# Patient Record
Sex: Female | Born: 1985 | Race: White | Hispanic: Yes | Marital: Single | Smoking: Never smoker
Health system: Southern US, Community
[De-identification: ages and names within clinical notes are randomized; demographics above are authoritative.]

## PROBLEM LIST (undated history)

## (undated) DIAGNOSIS — N879 Dysplasia of cervix uteri, unspecified: Secondary | ICD-10-CM

## (undated) DIAGNOSIS — R87610 Atypical squamous cells of undetermined significance on cytologic smear of cervix (ASC-US): Secondary | ICD-10-CM

## (undated) DIAGNOSIS — Z8759 Personal history of other complications of pregnancy, childbirth and the puerperium: Secondary | ICD-10-CM

## (undated) HISTORY — PX: OTHER SURGICAL HISTORY: SHX169

## (undated) HISTORY — DX: Atypical squamous cells of undetermined significance on cytologic smear of cervix (ASC-US): R87.610

## (undated) HISTORY — DX: Personal history of other complications of pregnancy, childbirth and the puerperium: Z87.59

## (undated) HISTORY — DX: Dysplasia of cervix uteri, unspecified: N87.9

---

## 2016-12-10 DIAGNOSIS — N879 Dysplasia of cervix uteri, unspecified: Secondary | ICD-10-CM | POA: Insufficient documentation

## 2018-06-28 ENCOUNTER — Other Ambulatory Visit: Payer: Self-pay | Admitting: Advanced Practice Midwife

## 2018-06-28 DIAGNOSIS — Z3481 Encounter for supervision of other normal pregnancy, first trimester: Secondary | ICD-10-CM

## 2018-06-28 LAB — OB RESULTS CONSOLE HIV ANTIBODY (ROUTINE TESTING): HIV: NONREACTIVE

## 2018-06-29 LAB — OB RESULTS CONSOLE VARICELLA ZOSTER ANTIBODY, IGG: Varicella: IMMUNE

## 2018-06-29 LAB — OB RESULTS CONSOLE HEPATITIS B SURFACE ANTIGEN: Hepatitis B Surface Ag: NEGATIVE

## 2018-06-29 LAB — OB RESULTS CONSOLE RPR: RPR: NONREACTIVE

## 2018-06-29 LAB — OB RESULTS CONSOLE RUBELLA ANTIBODY, IGM: Rubella: NON-IMMUNE/NOT IMMUNE

## 2018-07-05 ENCOUNTER — Ambulatory Visit
Admission: RE | Admit: 2018-07-05 | Discharge: 2018-07-05 | Disposition: A | Discharge status: Home / Self Care | Payer: Self-pay | Source: Ambulatory Visit | Attending: Advanced Practice Midwife | Admitting: Advanced Practice Midwife

## 2018-07-05 DIAGNOSIS — Z3689 Encounter for other specified antenatal screening: Secondary | ICD-10-CM | POA: Insufficient documentation

## 2018-07-05 DIAGNOSIS — Z3481 Encounter for supervision of other normal pregnancy, first trimester: Secondary | ICD-10-CM

## 2018-07-05 DIAGNOSIS — O208 Other hemorrhage in early pregnancy: Secondary | ICD-10-CM | POA: Insufficient documentation

## 2018-07-05 DIAGNOSIS — Z3A09 9 weeks gestation of pregnancy: Secondary | ICD-10-CM | POA: Insufficient documentation

## 2018-09-01 NOTE — L&D Delivery Note (Signed)
Date of delivery: 02/14/2019 Estimated Date of Delivery: 02/05/19 Patient's last menstrual period was 04/28/2018 (approximate). EGA: [redacted]w[redacted]d  Delivery Note At 6:23 AM a viable female was delivered via Vaginal, Spontaneous (Presentation: cephalic; direct OA).  APGAR: 3, 9; weight:  Pending skin to skin.   Placenta status: spontaneous, intact, circumvallate, several infarcts along fetal surface, no calcifications or thrombosed cotyledons.  Cord: 3vv, with the following complications: tight nuchal x1, reduced after delivery.  Cord pH: not collected.  Anesthesia:  None Episiotomy: None Lacerations: None Suture Repair: n/a Est. Blood Loss (mL): 15cc (measured)  Mom presented to L&D with induction of labor for past due date.  She was given cytotec x2 and spontneously ruptured. Labor progressed expectantly after that.  Progressed to complete, second stage: <20 mins, with delivery of fetal head and restitution to LOT.  Nuchal cord identified but unable to reduce at perineum.  Anterior then posterior shoulders delivered without difficulty.  Cord then reduced, and baby placed on mom's chest, and attended to by peds. Cord was clamped and cut after ~20 seconds; baby was flaccid for first minute of life, but by 2 minutes of life was crying, with appropriate tone, and improving color.   Placenta spontaneously delivered, intact.   IV pitocin given for hemorrhage prophylaxis.  We sang happy birthday to baby [name].   Mom to postpartum.  Baby to Couplet care / Skin to Skin.  Chelsea C Ward 02/14/2019, 7:19 AM

## 2018-11-15 LAB — OB RESULTS CONSOLE HIV ANTIBODY (ROUTINE TESTING): HIV: NONREACTIVE

## 2018-11-16 LAB — OB RESULTS CONSOLE RPR: RPR: NONREACTIVE

## 2019-01-12 LAB — OB RESULTS CONSOLE GC/CHLAMYDIA
Chlamydia: NEGATIVE
Gonorrhea: NEGATIVE

## 2019-01-12 LAB — OB RESULTS CONSOLE GBS: GBS: NEGATIVE

## 2019-02-09 ENCOUNTER — Encounter: Payer: Self-pay | Admitting: Certified Nurse Midwife

## 2019-02-09 ENCOUNTER — Other Ambulatory Visit: Payer: Self-pay | Admitting: Certified Nurse Midwife

## 2019-02-09 NOTE — Progress Notes (Signed)
Shirley Park is a 33 y.o. G69P2002 female dated by [redacted]w[redacted]d ultrasound on 07/05/2018.  Pregnancy Issues: 1. Elevated early 1h OGTT 137 with normal 3h 87/111/115/102 on 07/19/2018, normal 3h 83/91/132/109 on 11/15/2018 2. Prepregnancy BMI 35.4, on aspirin 81mg  3. History of abnormal pap smears with +hrHPV in 2016, 2018, and 2019; colposcopy scheduled for 03/2019 4. Illiterate: speaks Spanish, but cannot read or write in Spanish 5. Rubella non-immune  Prenatal care site: [facility]   Prenatal Labs: Blood type/Rh B+  Antibody screen neg  Rubella Non-immune  Varicella Immune  RPR NR  HBsAg Neg  HIV NR  GC neg  Chlamydia neg  Genetic screening declined  1 hour GTT 137 on 06/29/2018  3 hour GTT 87/111/115/102 on 07/19/2018 83/91/132/109 on 11/15/2018  GBS negative   Post Partum Planning: - Infant feeding: breast/formula - Contraception: Depo-Provera

## 2019-02-11 ENCOUNTER — Ambulatory Visit
Admission: RE | Admit: 2019-02-11 | Discharge: 2019-02-11 | Disposition: A | Discharge status: Home / Self Care | Payer: HRSA Program | Source: Ambulatory Visit | Attending: Certified Nurse Midwife | Admitting: Certified Nurse Midwife

## 2019-02-11 ENCOUNTER — Other Ambulatory Visit: Payer: Self-pay

## 2019-02-11 DIAGNOSIS — Z1159 Encounter for screening for other viral diseases: Secondary | ICD-10-CM | POA: Insufficient documentation

## 2019-02-12 LAB — NOVEL CORONAVIRUS, NAA (HOSP ORDER, SEND-OUT TO REF LAB; TAT 18-24 HRS): SARS-CoV-2, NAA: NOT DETECTED

## 2019-02-13 ENCOUNTER — Other Ambulatory Visit: Payer: Self-pay

## 2019-02-13 ENCOUNTER — Inpatient Hospital Stay
Admission: EM | Admit: 2019-02-13 | Discharge: 2019-02-15 | DRG: 807 | Disposition: A | Discharge status: Home / Self Care | Payer: Medicaid Other | Attending: Obstetrics & Gynecology | Admitting: Obstetrics & Gynecology

## 2019-02-13 DIAGNOSIS — Z3A41 41 weeks gestation of pregnancy: Secondary | ICD-10-CM

## 2019-02-13 DIAGNOSIS — O480 Post-term pregnancy: Secondary | ICD-10-CM | POA: Diagnosis present

## 2019-02-13 LAB — CBC
HCT: 37.2 % (ref 36.0–46.0)
Hemoglobin: 11.9 g/dL — ABNORMAL LOW (ref 12.0–15.0)
MCH: 24.6 pg — ABNORMAL LOW (ref 26.0–34.0)
MCHC: 32 g/dL (ref 30.0–36.0)
MCV: 77 fL — ABNORMAL LOW (ref 80.0–100.0)
Platelets: 306 10*3/uL (ref 150–400)
RBC: 4.83 MIL/uL (ref 3.87–5.11)
RDW: 16.8 % — ABNORMAL HIGH (ref 11.5–15.5)
WBC: 11.4 10*3/uL — ABNORMAL HIGH (ref 4.0–10.5)
nRBC: 0 % (ref 0.0–0.2)

## 2019-02-13 LAB — TYPE AND SCREEN
ABO/RH(D): B POS
Antibody Screen: NEGATIVE

## 2019-02-13 MED ORDER — MISOPROSTOL 25 MCG QUARTER TABLET
25.0000 ug | ORAL_TABLET | ORAL | Status: DC | PRN
  Administered 2019-02-13 (×2): 25 ug via BUCCAL
  Filled 2019-02-13 (×2): qty 1

## 2019-02-13 MED ORDER — MISOPROSTOL 200 MCG PO TABS
ORAL_TABLET | ORAL | Status: AC
  Administered 2019-02-13: 25 ug via BUCCAL
  Filled 2019-02-13: qty 4

## 2019-02-13 MED ORDER — LACTATED RINGERS IV SOLN
INTRAVENOUS | Status: DC
  Administered 2019-02-13 (×2): via INTRAVENOUS

## 2019-02-13 MED ORDER — OXYTOCIN 40 UNITS IN NORMAL SALINE INFUSION - SIMPLE MED: 2.5000 [IU]/h | INTRAVENOUS | Status: DC

## 2019-02-13 MED ORDER — OXYTOCIN BOLUS FROM INFUSION
500.0000 mL | Freq: Once | INTRAVENOUS | Status: AC
  Administered 2019-02-14: 500 mL via INTRAVENOUS

## 2019-02-13 MED ORDER — LIDOCAINE HCL (PF) 1 % IJ SOLN: 30.0000 mL | INTRAMUSCULAR | Status: DC | PRN

## 2019-02-13 MED ORDER — OXYTOCIN 10 UNIT/ML IJ SOLN
INTRAMUSCULAR | Status: AC
  Filled 2019-02-13: qty 2

## 2019-02-13 MED ORDER — ONDANSETRON HCL 4 MG/2ML IJ SOLN
4.0000 mg | Freq: Four times a day (QID) | INTRAMUSCULAR | Status: DC | PRN
  Administered 2019-02-14: 4 mg via INTRAVENOUS
  Filled 2019-02-13: qty 2

## 2019-02-13 MED ORDER — OXYTOCIN 40 UNITS IN NORMAL SALINE INFUSION - SIMPLE MED
INTRAVENOUS | Status: AC
  Filled 2019-02-13: qty 1000

## 2019-02-13 MED ORDER — MISOPROSTOL 25 MCG QUARTER TABLET
25.0000 ug | ORAL_TABLET | ORAL | Status: DC | PRN
  Administered 2019-02-13: 25 ug via VAGINAL
  Filled 2019-02-13 (×2): qty 1

## 2019-02-13 MED ORDER — SOD CITRATE-CITRIC ACID 500-334 MG/5ML PO SOLN: 30.0000 mL | ORAL | Status: DC | PRN

## 2019-02-13 MED ORDER — ACETAMINOPHEN 325 MG PO TABS: 650.0000 mg | ORAL_TABLET | ORAL | Status: DC | PRN

## 2019-02-13 MED ORDER — AMMONIA AROMATIC IN INHA
RESPIRATORY_TRACT | Status: AC
  Filled 2019-02-13: qty 10

## 2019-02-13 MED ORDER — TERBUTALINE SULFATE 1 MG/ML IJ SOLN: 0.2500 mg | Freq: Once | INTRAMUSCULAR | Status: DC | PRN

## 2019-02-13 MED ORDER — LIDOCAINE HCL (PF) 1 % IJ SOLN
INTRAMUSCULAR | Status: AC
  Filled 2019-02-13: qty 30

## 2019-02-13 MED ORDER — BUTORPHANOL TARTRATE 2 MG/ML IJ SOLN
1.0000 mg | INTRAMUSCULAR | Status: DC | PRN
  Administered 2019-02-14: 1 mg via INTRAVENOUS
  Filled 2019-02-13: qty 1

## 2019-02-13 MED ORDER — LACTATED RINGERS IV SOLN: 500.0000 mL | INTRAVENOUS | Status: DC | PRN

## 2019-02-13 NOTE — H&P (Signed)
OB History & Physical   History of Present Illness:  Chief Complaint:   HPI:  Shirley Park is a 33 y.o. 564P2002 female at 3527w1d dated by 9wk 2 day US.  She presents to L&D for IOL for past due date  Patient's last menstrual period was 04/28/2018 (approximate). Estimated Date of Delivery: 02/05/19   +FM, no CTX, no LOF, no VB  Pregnancy Issues: 1. 1. Elevated early 1h OGTT 137 with normal 3h 87/111/115/102 on 07/19/2018, normal 3h 83/91/132/109 on 11/15/2018 2. Prepregnancy BMI 35.4, on aspirin 81mg  3. History of abnormal pap smears with +hrHPV in 2016, 2018, and 2019; colposcopy scheduled for 03/2019 4. Illiterate: speaks Spanish, but cannot read or write in Spanish 5. Rubella non-immune  Maternal Medical History:  History reviewed. No pertinent past medical history.  History reviewed. No pertinent surgical history.  Not on File  Prior to Admission medications   Medication Sig Start Date End Date Taking? Authorizing Provider  Prenatal Vit-Fe Fumarate-FA (PRENATAL MULTIVITAMIN) TABS tablet Take 1 tablet by mouth daily at 12 noon.   Yes [provider]     [facility]ren[facility]   Social History: She  reports that she has never smoked. She has never used smokeless tobacco. She reports that she does not drink alcohol or use drugs.  Family History: family history is not on file. no gyn cancers  Review of Systems: A full review of systems was performed and negative except as noted in the HPI.     Physical Exam:  Vital Signs: BP 131/80 (BP Location: Right Arm)   Pulse 69   Temp 98.2 F (36.8 C) (Oral)   Resp 18   Ht 5\' 3"  (1.6 m)   Wt 90.7 kg   LMP 04/28/2018 (Approximate)   BMI 35.43 kg/m  General: no acute distress.  HEENT: normocephalic, atraumatic Heart: regular rate & rhythm.  No murmurs/rubs/gallops Lungs: clear to auscultation bilaterally, normal respiratory effort Abdomen: soft, gravid, non-tender;  EFW:  7lb3oz Pelvic:   External: Normal external female genitalia  Cervix: Dilation: Closed / Effacement (%): 60, 70 / Station: -3    Extremities: non-tender, symmetric,  Mild edema bilaterally.  DTRs: 2+ Neurologic: Alert & oriented x 3.    Results for orders placed or performed during the hospital encounter of 02/13/19 (from the past 24 hour(s))  CBC     Status: Abnormal   Collection Time: 02/13/19  2:29 PM  Result Value Ref Range   WBC 11.4 (H) 4.0 - 10.5 K/uL   RBC 4.83 3.87 - 5.11 MIL/uL   Hemoglobin 11.9 (L) 12.0 - 15.0 g/dL   HCT 40.937.2 81.136.0 - 91.446.0 %   MCV 77.0 (L) 80.0 - 100.0 fL   MCH 24.6 (L) 26.0 - 34.0 pg   MCHC 32.0 30.0 - 36.0 g/dL   RDW 78.216.8 (H) 95.611.5 - 21.315.5 %   Platelets 306 150 - 400 K/uL   nRBC 0.0 0.0 - 0.2 %  Type and screen     Status: None   Collection Time: 02/13/19  2:29 PM  Result Value Ref Range   ABO/RH(D) B POS    Antibody Screen NEG    Sample Expiration      02/16/2019,2359 Performed at [facility], [address] [place], [place] [address]     Pertinent Results:  Prenatal Labs: Blood type/Rh B+  Antibody screen neg  Rubella Non-immune  Varicella Immune  RPR NR  HBsAg Neg  HIV NR  GC neg  Chlamydia neg  Genetic screening declined  1 hour GTT 137 on 06/29/2018  3 hour GTT 87/111/115/102 on 07/19/2018 83/91/132/109 on 11/15/2018  GBS negative    FHT: 140 mod + accels no decels TOCO: infrequent SVE:  Dilation: Closed / Effacement (%): 60, 70 / Station: -3    Cephalic by leopolds    Assessment:  Shirley Park is a 33 y.o. G46P2002 female at [redacted]w[redacted]d with IOL for past due date.   Plan:  1. Admit to Labor & Delivery 2. CBC, T&S, Clrs, IVF 3. GBS negative 4. Consents obtained. 5. Continuous efm/toco 6. Category 1 7. IOL with cytotec for cervical ripening  Post Partum Planning: - Infant feeding: breast/formula - Contraception: Depo-Provera   ----- Shirley Days, MD Attending Obstetrician and Gynecologist [facility], Department of [facility]

## 2019-02-14 MED ORDER — ONDANSETRON HCL 4 MG/2ML IJ SOLN: 4.0000 mg | INTRAMUSCULAR | Status: DC | PRN

## 2019-02-14 MED ORDER — DIBUCAINE (PERIANAL) 1 % EX OINT: 1.0000 "application " | TOPICAL_OINTMENT | CUTANEOUS | Status: DC | PRN

## 2019-02-14 MED ORDER — IBUPROFEN 600 MG PO TABS
600.0000 mg | ORAL_TABLET | Freq: Four times a day (QID) | ORAL | Status: DC
  Administered 2019-02-14 – 2019-02-15 (×4): 600 mg via ORAL
  Filled 2019-02-14 (×4): qty 1

## 2019-02-14 MED ORDER — WITCH HAZEL-GLYCERIN EX PADS: 1.0000 "application " | MEDICATED_PAD | CUTANEOUS | Status: DC

## 2019-02-14 MED ORDER — BENZOCAINE-MENTHOL 20-0.5 % EX AERO
1.0000 "application " | INHALATION_SPRAY | CUTANEOUS | Status: DC | PRN
  Filled 2019-02-14: qty 56

## 2019-02-14 MED ORDER — ONDANSETRON HCL 4 MG PO TABS: 4.0000 mg | ORAL_TABLET | ORAL | Status: DC | PRN

## 2019-02-14 MED ORDER — ACETAMINOPHEN 500 MG PO TABS
1000.0000 mg | ORAL_TABLET | Freq: Four times a day (QID) | ORAL | Status: DC | PRN
  Administered 2019-02-15: 1000 mg via ORAL
  Filled 2019-02-14: qty 2

## 2019-02-14 MED ORDER — DIPHENHYDRAMINE HCL 25 MG PO CAPS: 25.0000 mg | ORAL_CAPSULE | Freq: Four times a day (QID) | ORAL | Status: DC | PRN

## 2019-02-14 MED ORDER — MEDROXYPROGESTERONE ACETATE 150 MG/ML IM SUSP
150.0000 mg | Freq: Once | INTRAMUSCULAR | Status: DC
  Filled 2019-02-14: qty 1

## 2019-02-14 MED ORDER — MEASLES, MUMPS & RUBELLA VAC IJ SOLR
0.5000 mL | Freq: Once | INTRAMUSCULAR | Status: AC
  Administered 2019-02-15: 0.5 mL via SUBCUTANEOUS
  Filled 2019-02-14 (×2): qty 0.5

## 2019-02-14 MED ORDER — DOCUSATE SODIUM 100 MG PO CAPS
100.0000 mg | ORAL_CAPSULE | Freq: Two times a day (BID) | ORAL | Status: DC
  Administered 2019-02-14 – 2019-02-15 (×2): 100 mg via ORAL
  Filled 2019-02-14 (×2): qty 1

## 2019-02-14 MED ORDER — COCONUT OIL OIL
1.0000 "application " | TOPICAL_OIL | Status: DC | PRN
  Administered 2019-02-14: 1 via TOPICAL
  Filled 2019-02-14 (×2): qty 120

## 2019-02-14 MED ORDER — SIMETHICONE 80 MG PO CHEW: 80.0000 mg | CHEWABLE_TABLET | ORAL | Status: DC | PRN

## 2019-02-14 MED ORDER — PRENATAL MULTIVITAMIN CH
1.0000 | ORAL_TABLET | Freq: Every day | ORAL | Status: DC
  Administered 2019-02-14: 1 via ORAL
  Filled 2019-02-14: qty 1

## 2019-02-14 NOTE — Lactation Note (Signed)
This note was copied from a baby's chart. Lactation Consultation Note  Patient Name: Shirley Park [id number] Date: 02/14/2019   Observed mom breast feeding latching [name] independently with strong, rhythmic sucking and swallows.  Mom had no pillow support.  Pillows given and situated for comfort.  Mom breast fed 34 year old for 1 year, but only breast fed 34 year old for 3 months.  Mom started giving bottles of formula with 34 year old early thinking she did not have enough breast milk.  Mom reports wanting to give formula in addition to breast feeding this baby and asked if she could start bottles at about 2 weeks if baby was not getting enough of her milk.  Reviewed through interpreter about supply and demand and need for frequent draining of breast to bring in mature milk and ensure a plentiful supply of milk.  Hand expressed colostrum demonstrating to her that she has colostrum.  Discussed size of [name]'s stomach right now and that she should have sufficient volumes to just give her breast milk if she put him to the breast whenever he demonstrated feeding cues.  Reviewed normal course of lactation and routine newborn feeding patterns.  Lactation name and number written on white board and encouraged to call with any questions, concerns or assistance.    Maternal Data    Feeding    LATCH Score                   Interventions    Lactation Tools Discussed/Used     Consult Status      Shirley Park 02/14/2019, 4:11 PM

## 2019-02-14 NOTE — Lactation Note (Addendum)
This note was copied from a baby's chart. Lactation Consultation Note  Patient Name: Shirley Park GUYQI'H Date: 02/14/2019 Reason for consult: Follow-up assessment  FOB asking for bottle of formula.  FOB was not in room when explained stomach size, supply and demand and feeding patterns so went over with him.  FOB concerned that Shirley Park is showing feeding cues as soon as he puts him back in the crib, but when he puts him back to the breast he sucks for only about 10 minutes and goes back to sleep.  He remembers that with his girls, mom seemed to have more milk.  Explained that for first few days she has colostrum which is only teaspoons and tablespoon, but when mature milk came in on day 2 to 5 that mom would have ounces.  Shown on gradufeed amounts [name]'s stomach could hold for now and that he should be getting that from the breast because we did not want to overfill his stomach and him start spitting.  He was also concerned that Shirley Park has not had a void yet.  Explained that on average babies only had 1 void and 1 stool whole first 24 hours because he is only taking in small amounts and Shirley Park just had his second stool which was probably why he was a little fussy after his feeding.  Shown him normal out put through first week of life.  Demonstrated hand expression and could get several drops of colostrum.  Discussed how thick colostrum was and how he had to suck harder to get it out of breast for now, but when mature milk came in it would be spraying out like mom remembered with others.  FOB satisfied with explanations of why to postpone giving bottles of formula for now.  Mom put Shirley Park back to the breast for another 10 minutes after stool changed and he was contented. Maternal Data Formula Feeding for Exclusion: No Has patient been taught Hand Expression?: Yes Does the patient have breastfeeding experience prior to this delivery?: Yes  Feeding Feeding Type: Breast Fed  LATCH Score Latch:  Grasps breast easily, tongue down, lips flanged, rhythmical sucking.  Audible Swallowing: A few with stimulation  Type of Nipple: Everted at rest and after stimulation  Comfort (Breast/Nipple): Filling, red/small blisters or bruises, mild/mod discomfort  Hold (Positioning): No assistance needed to correctly position infant at breast.  LATCH Score: 8  Interventions Interventions: Reverse pressure;Breast compression;Adjust position;Support pillows;Coconut oil  Lactation Tools Discussed/Used WIC Program: Yes(Self Pay)   Consult Status Consult Status: PRN Follow-up type: Call as needed    Jarold Motto 02/14/2019, 5:56 PM

## 2019-02-15 LAB — CBC
HCT: 33.3 % — ABNORMAL LOW (ref 36.0–46.0)
Hemoglobin: 10.4 g/dL — ABNORMAL LOW (ref 12.0–15.0)
MCH: 24.3 pg — ABNORMAL LOW (ref 26.0–34.0)
MCHC: 31.2 g/dL (ref 30.0–36.0)
MCV: 77.8 fL — ABNORMAL LOW (ref 80.0–100.0)
Platelets: 242 10*3/uL (ref 150–400)
RBC: 4.28 MIL/uL (ref 3.87–5.11)
RDW: 17.1 % — ABNORMAL HIGH (ref 11.5–15.5)
WBC: 13 10*3/uL — ABNORMAL HIGH (ref 4.0–10.5)
nRBC: 0 % (ref 0.0–0.2)

## 2019-02-15 LAB — RPR: RPR Ser Ql: NONREACTIVE

## 2019-02-15 MED ORDER — IBUPROFEN 600 MG PO TABS: 600.0000 mg | ORAL_TABLET | Freq: Four times a day (QID) | ORAL | 0 refills | Status: DC | PRN

## 2019-02-15 MED ORDER — ACETAMINOPHEN 500 MG PO TABS: 1000.0000 mg | ORAL_TABLET | Freq: Four times a day (QID) | ORAL | 0 refills | Status: DC | PRN

## 2019-02-15 NOTE — Discharge Summary (Signed)
Obstetric Discharge Summary   Patient Name: Shirley Park DOB: 1986-08-21 MRN: [id number]  Date of Admission: 02/13/2019 Date of Delivery: 01/14/2019 Delivered by: Larey Days, MD Date of Discharge: 02/15/2019  Primary OB: [facility]  [id number] last menstrual period was 04/28/2018 (approximate). EDC Estimated Date of Delivery: 02/05/19 Gestational Age at Delivery: [redacted]w[redacted]d   Antepartum complications:  1.Elevated early 1h OGTT 137 with normal 3h 87/111/115/102 on 07/19/2018, normal 3h 83/91/132/109 on 11/15/2018 2. Prepregnancy BMI 35.4, on aspirin 81mg  3. History of abnormal pap smears with +hrHPV in 2016, 2018, and 2019; colposcopy scheduled for 03/2019 4. Illiterate: speaks Spanish, but cannot read or write in Spanish 5. Rubella non-immune  Admitting Diagnosis: Planned induction of labor  Secondary Diagnoses: Patient Active Problem List   Diagnosis Date Noted  . Labor and delivery indication for care or intervention 02/13/2019    Induction/Augmentation: Cytotec Complications: None Intrapartum complications/course: Mom presented to L&D with induction of labor for past due date.  She was given cytotec x2 and spontneously ruptured. Labor progressed expectantly after that.  Progressed to complete, second stage: <20 mins, with delivery of fetal head and restitution to LOT.  Nuchal cord identified but unable to reduce at perineum.  Anterior then posterior shoulders delivered without difficulty.  Cord then reduced, and baby placed on mom's chest, and attended to by peds. Cord was clamped and cut after ~20 seconds; baby was flaccid for first minute of life, but by 2 minutes of life was crying, with appropriate tone, and improving color.   Placenta spontaneously delivered, intact.   IV pitocin given for hemorrhage prophylaxis. Delivery Type: spontaneous vaginal delivery Anesthesia: none Placenta: spontaneous Laceration: none Episiotomy: none  Newborn Data: Live born female  Birth Weight:  8 lb 6 oz (3800 g) APGAR: 3, 9  Newborn Delivery   Birth date/time: 02/14/2019 06:23:00 Delivery type: Vaginal, Spontaneous       Postpartum Course  Patient had an uncomplicated postpartum course.  By time of discharge on PPD#1, her pain was controlled on oral pain medications; she had appropriate lochia and was ambulating, voiding without difficulty and tolerating regular diet.  She was deemed stable for discharge to home.       Labs: CBC Latest Ref Rng & Units 02/15/2019 02/13/2019  WBC 4.0 - 10.5 K/uL 13.0(H) 11.4(H)  Hemoglobin 12.0 - 15.0 g/dL 10.4(L) 11.9(L)  Hematocrit 36.0 - 46.0 % 33.3(L) 37.2  Platelets 150 - 400 K/uL 242 306   B POS  Physical exam:  BP 115/86 (BP Location: Left Arm)   Pulse 62   Temp 98.4 F (36.9 C) (Oral)   Resp 20   Ht 5\' 3"  (1.6 m)   Wt 90.7 kg   LMP 04/28/2018 (Approximate)   SpO2 100% Comment: Room Air  Breastfeeding Unknown   BMI 35.43 kg/m  General: alert and no distress Pulm: normal respiratory effort Lochia: appropriate Abdomen: soft, NT Uterine Fundus: firm, below umbilicus Extremities: No evidence of DVT seen on physical exam. No lower extremity edema.   Disposition: stable, discharge to home Baby Feeding: breastmilk Baby Disposition: home with mom  Contraception: Nexplanon  Prenatal Labs:  Blood type/Rh B+  Antibody screen neg  Rubella Non-immune  Varicella Immune  RPR NR  HBsAg Neg  HIV NR  GC neg  Chlamydia neg  Genetic screening declined  1 hour GTT 137 on 06/29/2018  3 hour GTT 87/111/115/102 on 07/19/2018 83/91/132/109 on 11/15/2018  GBS negative    Rh Immune globulin given: n/a Rubella vaccine given: ordered PP Tdap  vaccine given in AP or PP setting: 11/08/2018 Flu vaccine given in AP or PP setting: declined AP  Plan:  Shirley Park was discharged to home in good condition. [facility] with delivery provider in 6 weeks  Discharge Instructions: Per After Visit  Summary. Activity: Advance as tolerated. Pelvic rest for 6 weeks.   Diet: Regular Discharge Medications: Allergies as of 02/15/2019   No Known Allergies     Medication List    TAKE these medications   acetaminophen 500 MG tablet Commonly known as: TYLENOL Take 2 tablets (1,000 mg total) by mouth every 6 (six) hours as needed for mild pain or moderate pain.   ibuprofen 600 MG tablet Commonly known as: ADVIL Take 1 tablet (600 mg total) by mouth every 6 (six) hours as needed for mild pain, moderate pain or cramping.   prenatal multivitamin Tabs tablet Take 1 tablet by mouth daily at 12 noon.      Outpatient follow up:  Follow-up Information    Ward, Elenora Fenderhelsea C, MD. Schedule an appointment as soon as possible for a visit in 6 week(s).   Specialty: Obstetrics and Gynecology Why: For routine postpartum visit Contact information: [address] [place] [address] [phone]            Signed:  Genia DelMargaret Lillian Ballester  02/15/2019 8:54 AM

## 2019-02-15 NOTE — Progress Notes (Signed)
DC inst reviewed with pt via [place].  Pt and husband verb u/o.  DC to car via wc by staff

## 2019-02-15 NOTE — Discharge Instructions (Signed)

## 2019-02-16 LAB — SURGICAL PATHOLOGY

## 2019-03-28 ENCOUNTER — Other Ambulatory Visit: Payer: Self-pay

## 2019-03-28 ENCOUNTER — Encounter: Payer: Self-pay | Admitting: Nurse Practitioner

## 2019-03-28 ENCOUNTER — Ambulatory Visit: Payer: Self-pay | Admitting: Nurse Practitioner

## 2019-03-28 DIAGNOSIS — E669 Obesity, unspecified: Secondary | ICD-10-CM | POA: Insufficient documentation

## 2019-03-28 DIAGNOSIS — Z30013 Encounter for initial prescription of injectable contraceptive: Secondary | ICD-10-CM

## 2019-03-28 DIAGNOSIS — O99019 Anemia complicating pregnancy, unspecified trimester: Secondary | ICD-10-CM

## 2019-03-28 LAB — HEMOGLOBIN, FINGERSTICK: Hemoglobin: 12.5 g/dL (ref 11.1–15.9)

## 2019-03-28 MED ORDER — MEDROXYPROGESTERONE ACETATE 150 MG/ML IM SUSP
150.0000 mg | Freq: Once | INTRAMUSCULAR | Status: AC
  Administered 2019-03-28: 11:00:00 150 mg via INTRAMUSCULAR

## 2019-03-28 NOTE — Progress Notes (Signed)
Family Planning Visit- Post partum visit - desires DMPA for [facility]  Subjective:  Shirley Park is a 33 y.o. being seen today for an well woman visit and to discuss family planning options.    She is currently using Depo-Provera injections for pregnancy prevention. Patient reports she does not if she or her partner wants a pregnancy in the next year. Patient  has Labor and delivery indication for care or intervention on their problem list.  Chief Complaint  Patient presents with  . Postpartum Care    Patient reports - 4 wks postpartum SVD    Patient denies  - any sexual activity in last several wks    Does the patient desire a pregnancy in the next year? (OKQ flowsheet)  See flowsheet for other program required questions.   Body mass index is 37.18 kg/m. - Patient is eligible for diabetes screening based on BMI and age >51?  not applicable OA4Z ordered? no  Patient reports 1 of partners in last year. Desires STI screening?  No - post partum  Does the patient have a current or past history of drug use? No   No components found for: HCV]   Health Maintenance Due  Topic Date Due  . HIV Screening  09/17/1999  . TETANUS/TDAP  09/17/2003  . PAP SMEAR-Modifier  09/16/2005    Review of Systems  Constitutional: Negative.   HENT: Negative.   Eyes: Negative.   Respiratory: Negative.   Cardiovascular: Negative.   Gastrointestinal: Negative.   Genitourinary: Negative.   Skin: Negative.   Endo/Heme/Allergies: Negative.   Psychiatric/Behavioral: Negative.     The following portions of the patient's history were reviewed and updated as appropriate: allergies, current medications, past family history, past medical history, past social history, past surgical history and problem list. Problem list updated.  Objective:   Vitals:   03/28/19 1035  BP: 124/83  Weight: 200 lb (90.7 kg)  Height: 5' 1.5" (1.562 m)    Physical Exam Vitals signs and nursing note reviewed.   Constitutional:      Appearance: Normal appearance. She is well-developed. She is obese.  HENT:     Mouth/Throat:     Mouth: Mucous membranes are moist.     Pharynx: Oropharynx is clear. Uvula midline.     Comments: Fillings and several missing teeth noted Neck:     Musculoskeletal: Full passive range of motion without pain, normal range of motion and neck supple. No muscular tenderness.  Cardiovascular:     Rate and Rhythm: Normal rate and regular rhythm.     Heart sounds: Normal heart sounds. No murmur.  Pulmonary:     Effort: Pulmonary effort is normal.     Breath sounds: Normal breath sounds.  Chest:     Comments: Client admits to breastfeeding Lymphadenopathy:     Cervical: No cervical adenopathy.  Skin:    General: Skin is warm and dry.  Neurological:     Mental Status: She is alert.  Psychiatric:        Attention and Perception: Attention normal.        Mood and Affect: Mood normal.        Behavior: Behavior normal. Behavior is cooperative.       Assessment and Plan:  Shirley Park is a 33 y.o. female presenting to the [facility] Department for an initial well woman exam/family planning visit  Contraception counseling: Reviewed all forms of birth control options available including abstinence; over the counter/barrier methods; hormonal contraceptive medication  including pill, patch, ring, injection,contraceptive implant; hormonal and nonhormonal IUDs; permanent sterilization options including vasectomy and the various tubal sterilization modalities. Risks and benefits reviewed.  Questions were answered.  Written information was also given to the patient to review.  Patient desires DMPA, this was prescribed for patient. She will follow up in  11-13 wks for surveillance.  She was told to call with any further questions, or with any concerns about this method of contraception.  Emphasized use of condoms 100% of the time for STI prevention.  1. Post  partum visit  Please await Hgb results  Please give DMPA 150 mg IM in arm today then RTC in 11-13 wks for DMPA x 1 year     No follow-ups on file.  No future appointments.  Donn PieriniKarla W Armine Rizzolo, NP

## 2019-03-28 NOTE — Progress Notes (Signed)
In for PP visit; discussed [facility] appt: 03/30/19 @ 11:30; declines HIV/RPR testing Debera Lat, RN  Per FNP-Depo adm. R. delt-well tolerated Debera Lat, RN

## 2019-03-28 NOTE — Patient Instructions (Signed)
Anticonceptivos inyectables Contraceptive Injection Los anticonceptivos inyectables previenen el embarazo mediante la aplicacin de una inyeccin. Se denomina tambin inyeccin anticonceptiva. La inyeccin contiene una hormona llamada progestina, que previene el embarazo al:  Impedir que los ovarios liberen vulos.  Hacer que el moco cervical se espese a fin de evitar que los espermatozoides ingresen al cuello uterino.  Estrechar el revestimiento interior del tero a fin de evitar que un vulo fecundado se adhiera al tero. Las inyecciones anticonceptivas se administran debajo de la piel (son subcutneas) o dentro del msculo (son intramusculares). Para que estas inyecciones funcionen, un mdico debe aplicarle una cada 3 meses (12 semanas). Informe al mdico acerca de lo siguiente:  Cualquier alergia que tenga.  Todos los [employer], incluidos vitaminas, hierbas, gotas oftlmicas, cremas y medicamentos de venta libre.  Cualquier enfermedad de la sangre que tenga.  Cualquier afeccin mdica que tenga.  Si est embarazada o podra estarlo. Cules son los riesgos? En general, se trata de un procedimiento seguro. Sin embargo, pueden ocurrir complicaciones, por ejemplo:  Cambios de humor o depresin.  Prdida de densidad sea (osteoporosis) luego de un uso a largo plazo.  Cogulos de [place].  Riesgo mayor de que un vulo sea fertilizado fuera del tero (embarazo ectpico).Esto es poco frecuente. Qu ocurre antes del procedimiento?  El V[employer]om har un examen fsico de [place].  Se le administrar una prueba para ver si est embarazada. Qu ocurre durante el procedimiento?  Se limpiar y se desinfectar la zona donde se aplicar la inyeccin.  Se insertar una aguja en un msculo del antebrazo o la nalga, o debajo de la piel del muslo o del abdomen. La aguja estar conectada a una jeringa que contendr [employer].  El medicamento se empujar a travs de la  [place] y se Tour manager en el cuerpo.  Tal vez le coloquen una venda (vendaje) sobre el sitio de la inyeccin. Qu puedo esperar despus del procedimiento?  Despus del procedimiento, es comn [employer] siguientes sntomas: ? Social research officer, government alrededor del sitio de la inyeccin durante un par [employer]. ? Sangrados menstruales irregulares. ? Aumento de [place]. ? Dolor a Radiographer, therapeutic. ? Dolores de [place]. ? Molestias en el abdomen.  Pregntele al mdico si necesita usar un mtodo anticonceptivo adicional (anticoncepcin de respaldo), como un condn, [place] o espermicida. ? Si se administra la primera inyeccin entre 1 a 7 das luego del comienzo de su ltimo perodo, no Banker de respaldo. ? Si se administra la primera inyeccin en cualquier otro momento del ciclo menstrual, debe Warehouse manager relaciones sexuales o necesitar anticoncepcin de respaldo durante 7 das luego de recibir la inyeccin. Siga estas indicaciones en su casa: Instrucciones generales   Delphi de venta libre y los recetados solamente como se lo haya indicado el mdico.  No masajee el sitio de la inyeccin.  Lleve un registro de sus perodos menstruales para saber si se vuelven irregulares.  Use siempre un preservativo para protegerse contra las infecciones de transmisin sexual (ITS).  Asegrese de Psychiatrist cita a tiempo para su prxima inyeccin, y [place] en su calendario. A fin de que el mtodo anticonceptivo evite el [place], debe recibir las inyecciones cada 3 meses (12semanas). Estilo de vida  No consuma ningn producto que contenga nicotina o tabaco, como cigarrillos y Psychologist, sport and exercise. Si necesita ayuda para dejar de fumar, consulte al mdico.  Consuma alimentos ricos en calcio y vitaminaD, como [place], queso y salmn. Esto puede ayudar  a evitar cualquier prdida en la densidad sea provocada por los anticonceptivos inyectables. Pdale recomendaciones al  mdico respecto de la dieta. Comunquese con un mdico si:  Tiene nuseas o vmitos.  Tiene secrecin o sangrado vaginal anormal.  No tiene su perodo menstrual o piensa que podra estar embarazada.  Presenta cambios en el estado de nimo o depresin.  Se siente mareada o siente que va a desvanecerse.  Siente dolor en la pierna. Solicite ayuda de inmediato si:  Midwifeiente dolor en el pecho.  Tose y escupe sangre.  Le falta el aire.  Siente un dolor de cabeza intenso que no se [place].  Siente que alguna parte del cuerpo est entumecida.  Tiene dificultad para hablar.  Tiene problemas de visin.  Tiene sangrado vaginal que es anormalmente abundante o no se detiene.  Siente un dolor intenso en el abdomen.  Tiene depresin que no mejora con Scientist, research (medical)el tratamiento. Si alguna vez siente que puede lastimarse a usted misma o a Economistotras personas, o tiene pensamientos de poner fin a su vida, busque ayuda de inmediato. Puede dirigirse al servicio de emergencias ms cercano o comunicarse con:  El servicio de emergencias de su localidad (911 en [place].).  Una lnea de asistencia al suicida y Visual merchandiseratencin en crisis, como la Murphy OilLnea Nacional de Prevencin del Suicidio (National Suicide Prevention Lifeline), al [phone]. Est disponible las 24 horas del da. Resumen  Los anticonceptivos inyectables previenen el embarazo mediante la aplicacin de una inyeccin. Se denomina tambin inyeccin anticonceptiva.  Esta inyeccin se administra debajo de la piel (subcutnea) o en un msculo (intramuscular).  Despus de este procedimiento, es frecuente Surveyor, miningtener dolor alrededor del sitio de la inyeccin durante un par [place] das.  Para evitar el embarazo, un mdico debe administrar la inyeccin cada 3 meses (12 semanas).  Luego de recibir Psychologist, forensicla inyeccin, pregntele al mdico si necesita usar un mtodo anticonceptivo adicional (anticoncepcin de respaldo), como un condn, Tano Roadesponja o espermicida. Esta informacin no  tiene Theme park managercomo fin reemplazar el consejo del mdico. Asegrese de hacerle al mdico cualquier pregunta que tenga. Document Released: 06/15/2017 Document Revised: 06/15/2017 Document Reviewed: 06/15/2017 Elsevier Patient Education  2020 [employer]

## 2019-06-17 ENCOUNTER — Other Ambulatory Visit: Payer: Self-pay

## 2019-06-17 ENCOUNTER — Ambulatory Visit (LOCAL_COMMUNITY_HEALTH_CENTER): Payer: Self-pay

## 2019-06-17 VITALS — BP 111/77 | Ht 61.5 in | Wt 198.0 lb

## 2019-06-17 DIAGNOSIS — Z30013 Encounter for initial prescription of injectable contraceptive: Secondary | ICD-10-CM

## 2019-06-17 DIAGNOSIS — Z3009 Encounter for other general counseling and advice on contraception: Secondary | ICD-10-CM

## 2019-06-17 MED ORDER — MULTI-VITAMIN/MINERALS PO TABS: 1.0000 | ORAL_TABLET | Freq: Every day | ORAL | 0 refills | Status: DC

## 2019-06-17 MED ORDER — MEDROXYPROGESTERONE ACETATE 150 MG/ML IM SUSP
150.0000 mg | Freq: Once | INTRAMUSCULAR | Status: AC
  Administered 2019-06-17: 10:00:00 150 mg via INTRAMUSCULAR

## 2019-06-17 NOTE — Progress Notes (Signed)
Folic acid counseling completed and MVI dispensed. Depo administered per 03/28/2019 written order of Jerline Pain FNP-BC. Client tolerated injection without complaint. Rich Number, RN

## 2019-09-21 ENCOUNTER — Ambulatory Visit (LOCAL_COMMUNITY_HEALTH_CENTER): Payer: Self-pay

## 2019-09-21 ENCOUNTER — Other Ambulatory Visit: Payer: Self-pay

## 2019-09-21 VITALS — BP 114/79 | Ht 62.0 in | Wt 198.0 lb

## 2019-09-21 DIAGNOSIS — Z30013 Encounter for initial prescription of injectable contraceptive: Secondary | ICD-10-CM

## 2019-09-21 DIAGNOSIS — Z3009 Encounter for other general counseling and advice on contraception: Secondary | ICD-10-CM

## 2019-09-21 MED ORDER — MEDROXYPROGESTERONE ACETATE 150 MG/ML IM SUSP
150.0000 mg | Freq: Once | INTRAMUSCULAR | Status: AC
  Administered 2019-09-21: 10:00:00 150 mg via INTRAMUSCULAR

## 2019-09-21 NOTE — Progress Notes (Signed)
Depo given per Leath order on 03/28/19. Tolerated well. Richmond Campbell, RN

## 2019-12-07 ENCOUNTER — Other Ambulatory Visit: Payer: Self-pay

## 2019-12-07 ENCOUNTER — Ambulatory Visit (LOCAL_COMMUNITY_HEALTH_CENTER): Payer: Self-pay | Admitting: Advanced Practice Midwife

## 2019-12-07 ENCOUNTER — Encounter: Payer: Self-pay | Admitting: Advanced Practice Midwife

## 2019-12-07 VITALS — BP 104/72 | Wt 199.6 lb

## 2019-12-07 DIAGNOSIS — Z30013 Encounter for initial prescription of injectable contraceptive: Secondary | ICD-10-CM

## 2019-12-07 DIAGNOSIS — Z3009 Encounter for other general counseling and advice on contraception: Secondary | ICD-10-CM

## 2019-12-07 MED ORDER — MEDROXYPROGESTERONE ACETATE 150 MG/ML IM SUSP
150.0000 mg | Freq: Once | INTRAMUSCULAR | Status: AC
  Administered 2019-12-07: 09:00:00 150 mg via INTRAMUSCULAR

## 2019-12-07 NOTE — Progress Notes (Signed)
Patient here for Depo at 11 weeks since last Depo given. Patient given Depo per order for DMPA 150mg  IM q 11-13 wks for 1 year, written by provider, at patient PP visit 03/27/2020. Depo given, left deltoid, tolerated well, next Depo card given.03/29/2020, RN

## 2019-12-17 ENCOUNTER — Ambulatory Visit: Payer: Self-pay | Attending: Internal Medicine

## 2019-12-17 DIAGNOSIS — Z23 Encounter for immunization: Secondary | ICD-10-CM

## 2019-12-17 NOTE — Progress Notes (Signed)
   Covid-19 Vaccination Clinic  Name:  Shirley Park    MRN: [id number] DOB: 01/07/1986  12/17/2019  [name] was observed post Covid-19 immunization for 15 minutes without incident. She was provided with Vaccine Information Sheet and instruction to access the V-Safe system.   [name] was instructed to call 911 with any severe reactions post vaccine: Marland Kitchen Difficulty breathing  . Swelling of face and throat  . A fast heartbeat  . A bad rash all over body  . Dizziness and weakness   Immunizations Administered    Name Date Dose VIS Date Route   Pfizer COVID-19 Vaccine 12/17/2019  8:35 AM 0.3 mL 08/12/2019 Intramuscular   Manufacturer: [employer], [employer]   Lot: [id number]   NDC: [address]-9

## 2020-01-11 ENCOUNTER — Ambulatory Visit: Payer: Self-pay | Attending: Internal Medicine

## 2020-01-11 DIAGNOSIS — Z23 Encounter for immunization: Secondary | ICD-10-CM

## 2020-01-11 NOTE — Progress Notes (Signed)
   Covid-19 Vaccination Clinic  Name:  Shirley Park    MRN: [id number] DOB: [birth year]  01/11/2020  [name] was observed post Covid-19 immunization for 15 minutes without incident. She was provided with Vaccine Information Sheet and instruction to access the V-Safe system.   [name] was instructed to call 911 with any severe reactions post vaccine: Marland Kitchen Difficulty breathing  . Swelling of face and throat  . A fast heartbeat  . A bad rash all over body  . Dizziness and weakness   Immunizations Administered    Name Date Dose VIS Date Route   Pfizer COVID-19 Vaccine 01/11/2020  8:28 AM 0.3 mL 10/26/2018 Intramuscular   Manufacturer: [employer], [employer]   Lot: [id number]   NDC: [address]-7

## 2020-02-24 ENCOUNTER — Ambulatory Visit (LOCAL_COMMUNITY_HEALTH_CENTER): Payer: Self-pay

## 2020-02-24 ENCOUNTER — Other Ambulatory Visit: Payer: Self-pay

## 2020-02-24 VITALS — BP 111/80 | Ht 61.5 in | Wt 200.0 lb

## 2020-02-24 DIAGNOSIS — Z30013 Encounter for initial prescription of injectable contraceptive: Secondary | ICD-10-CM

## 2020-02-24 DIAGNOSIS — Z3042 Encounter for surveillance of injectable contraceptive: Secondary | ICD-10-CM

## 2020-02-24 DIAGNOSIS — Z3009 Encounter for other general counseling and advice on contraception: Secondary | ICD-10-CM

## 2020-02-24 MED ORDER — MEDROXYPROGESTERONE ACETATE 150 MG/ML IM SUSP
150.0000 mg | Freq: Once | INTRAMUSCULAR | Status: AC
  Administered 2020-02-24: 150 mg via INTRAMUSCULAR

## 2020-02-24 NOTE — Progress Notes (Signed)
Pt is 11.2 weeks post depo today. DMPA 150 mg IM administered per Jerilee Hoh, FNP order dated 03/28/19. Pt to schedule physical when next depo is due.

## 2020-05-11 ENCOUNTER — Other Ambulatory Visit: Payer: Self-pay

## 2020-05-11 ENCOUNTER — Ambulatory Visit (LOCAL_COMMUNITY_HEALTH_CENTER): Payer: Self-pay | Admitting: Physician Assistant

## 2020-05-11 VITALS — BP 110/78 | Ht 62.5 in | Wt 198.4 lb

## 2020-05-11 DIAGNOSIS — Z01419 Encounter for gynecological examination (general) (routine) without abnormal findings: Secondary | ICD-10-CM

## 2020-05-11 DIAGNOSIS — Z3009 Encounter for other general counseling and advice on contraception: Secondary | ICD-10-CM

## 2020-05-11 DIAGNOSIS — Z3042 Encounter for surveillance of injectable contraceptive: Secondary | ICD-10-CM

## 2020-05-11 MED ORDER — MEDROXYPROGESTERONE ACETATE 150 MG/ML IM SUSP
150.0000 mg | INTRAMUSCULAR | Status: AC
  Administered 2020-05-11 – 2020-08-03 (×2): 150 mg via INTRAMUSCULAR

## 2020-05-11 NOTE — Progress Notes (Signed)
Patient here today for physical and depo. Last depo 12/2019. Condoms given. Interpreter, Roddie Mc used for interview. Interpreter read all documents and consent forms to patient.   Post: Depo 150mg  IM admin in right arm. Reminder card for next depo given. Condoms given. All questions answered.  , RN

## 2020-05-12 ENCOUNTER — Encounter: Payer: Self-pay | Admitting: Physician Assistant

## 2020-05-12 NOTE — Progress Notes (Signed)
Family Planning Visit- Repeat Yearly Visit  Subjective:  Shirley Park is a 34 y.o. G3P3003  being seen today for an well woman visit and to discuss family planning options.    She is currently using Depo Provera for pregnancy prevention. Patient reports she does not  want a pregnancy in the next year. Patient  has Labor and delivery indication for care or intervention; Obesity; and Cervical dysplasia on their problem list.  Chief Complaint  Patient presents with  . Contraception    physical and depo    Patient reports that she is doing well with the Depo and desires to continue with this as her BCM.  Per chart review, patient had abnormal pap and referred to [facility] for follow up.  Last info in chart was that patient was seen at [facility] 03/2019, and had normal colpo.  Will need follow up pap today.  Per chart review, last Depo was given 02/24/2020, so patient is 11 weeks since last Depo.  Patient denies any other concerns today.   See flowsheet for other program required questions.   Body mass index is 35.71 kg/m. - Patient is eligible for diabetes screening based on BMI and age >38?  not applicable HA1C ordered? not applicable  Patient reports 1 partners in last year. Desires STI screening?  No - patient declines.   Has patient been screened once for HCV in the past?  No  No results found for: HCVAB  Does the patient have current of drug use, have a partner with drug use, and/or has been incarcerated since last result? No  If yes-- Screen for HCV through [facility]   Does the patient meet criteria for HBV testing? No  Criteria:  -Household, sexual or needle sharing contact with HBV -History of drug use -HIV positive -Those with known Hep C   Health Maintenance Due  Topic Date Due  . Hepatitis C Screening  Never done  . TETANUS/TDAP  Never done  . PAP SMEAR-Modifier  Never done  . INFLUENZA VACCINE  Never done    Review of Systems  All other systems reviewed and are  negative.   The following portions of the patient's history were reviewed and updated as appropriate: allergies, current medications, past family history, past medical history, past social history, past surgical history and problem list. Problem list updated.  Objective:   Vitals:   05/11/20 0847  BP: 110/78  Weight: 198 lb 6.4 oz (90 kg)  Height: 5' 2.5" (1.588 m)    Physical Exam Vitals and nursing note reviewed.  Constitutional:      General: She is not in acute distress.    Appearance: Normal appearance.  HENT:     Head: Normocephalic and atraumatic.  Eyes:     Conjunctiva/sclera: Conjunctivae normal.  Neck:     Thyroid: No thyroid mass, thyromegaly or thyroid tenderness.  Cardiovascular:     Rate and Rhythm: Normal rate and regular rhythm.  Pulmonary:     Effort: Pulmonary effort is normal.     Breath sounds: Normal breath sounds.  Chest:     Breasts:        Right: Normal. No mass, nipple discharge, skin change or tenderness.        Left: Normal. No mass, nipple discharge, skin change or tenderness.  Abdominal:     Palpations: Abdomen is soft. There is no mass.     Tenderness: There is no abdominal tenderness. There is no guarding or rebound.  Genitourinary:  General: Normal vulva.     Rectum: Normal.     Comments: External genitalia/pubic area without nits, lice, edema, erythema, lesions and inguinal adenopathy. Vagina with normal mucosa and discharge. Cervix without visible lesions. Uterus firm, mobile, nt, no masses, no CMT, no adnexal tenderness or fullness. Musculoskeletal:     Cervical back: Neck supple. No tenderness.  Lymphadenopathy:     Cervical: No cervical adenopathy.     Upper Body:     Right upper body: No supraclavicular, axillary or pectoral adenopathy.     Left upper body: No supraclavicular, axillary or pectoral adenopathy.  Skin:    General: Skin is warm and dry.  Neurological:     Mental Status: She is alert and oriented to person,  place, and time.  Psychiatric:        Mood and Affect: Mood normal.        Behavior: Behavior normal.        Thought Content: Thought content normal.        Judgment: Judgment normal.       Assessment and Plan:  Jenaveve Fenstermaker is a 34 y.o. female G3P3003 presenting to the [facility] for an yearly well woman exam/family planning visit  Contraception counseling: Reviewed all forms of birth control options in the tiered based approach. available including abstinence; over the counter/barrier methods; hormonal contraceptive medication including pill, patch, ring, injection,contraceptive implant, ECP; hormonal and nonhormonal IUDs; permanent sterilization options including vasectomy and the various tubal sterilization modalities. Risks, benefits, and typical effectiveness rates were reviewed.  Questions were answered.  Written information was also given to the patient to review.  Patient desires to continue with Depo, this was prescribed for patient. She will follow up in  3 months and prn for surveillance.  She was told to call with any further questions, or with any concerns about this method of contraception.  Emphasized use of condoms 100% of the time for STI prevention.  Patient was not a candidate for ECP today.   1. Encounter for counseling regarding contraception Reviewed with patient normal SE of Depo and when to call clinic with concerns. Enc condoms with all sex for STD protection.  2. Well woman exam with routine gynecological exam Reviewed with patient healthy habits for general health. Enc MVI 1 po daily. Enc to establish with/follow up with PCP for primary care concerns and illness. Await pap results.  Counseled that RN will call or send a letter once results are back.  - IGP, Aptima HPV  3. Surveillance for Depo-Provera contraception OK to continue with Depo 150 mg IM q 11-13 weeks for 1 year. - medroxyPROGESTERone (DEPO-PROVERA) injection 150  mg     Return in about 11 weeks (around 07/27/2020) for depo and prn.  No future appointments.  Matt Holmes, PA

## 2020-05-14 LAB — IGP, APTIMA HPV
HPV Aptima: NEGATIVE
PAP Smear Comment: 0

## 2020-08-03 ENCOUNTER — Other Ambulatory Visit: Payer: Self-pay

## 2020-08-03 ENCOUNTER — Ambulatory Visit (LOCAL_COMMUNITY_HEALTH_CENTER): Payer: Self-pay

## 2020-08-03 VITALS — BP 119/79 | Ht 62.5 in | Wt 199.0 lb

## 2020-08-03 DIAGNOSIS — Z3009 Encounter for other general counseling and advice on contraception: Secondary | ICD-10-CM

## 2020-08-03 DIAGNOSIS — Z3042 Encounter for surveillance of injectable contraceptive: Secondary | ICD-10-CM

## 2020-08-03 NOTE — Progress Notes (Signed)
Pt is 12.0 weeks post depo today. DMPA 150 mg IM administered per Sadie Haber, PA order dated 05/11/20.

## 2021-06-21 ENCOUNTER — Other Ambulatory Visit: Payer: Self-pay

## 2021-06-21 ENCOUNTER — Encounter: Payer: Self-pay | Admitting: Advanced Practice Midwife

## 2021-06-21 ENCOUNTER — Ambulatory Visit (LOCAL_COMMUNITY_HEALTH_CENTER): Payer: Self-pay | Admitting: Advanced Practice Midwife

## 2021-06-21 VITALS — BP 123/80 | Ht 62.5 in | Wt 201.4 lb

## 2021-06-21 DIAGNOSIS — Z30011 Encounter for initial prescription of contraceptive pills: Secondary | ICD-10-CM

## 2021-06-21 DIAGNOSIS — Z3009 Encounter for other general counseling and advice on contraception: Secondary | ICD-10-CM

## 2021-06-21 DIAGNOSIS — N879 Dysplasia of cervix uteri, unspecified: Secondary | ICD-10-CM

## 2021-06-21 DIAGNOSIS — Z3201 Encounter for pregnancy test, result positive: Secondary | ICD-10-CM

## 2021-06-21 LAB — PREGNANCY, URINE: Preg Test, Ur: POSITIVE — AB

## 2021-06-21 NOTE — Progress Notes (Signed)
Patient pregnancy test was positive 06/21/21. Patient was unable to be seen in family planning clinic. Patient was brought to the front desk to make a new OB appointment in the [facility] maternity clinic. Positive Pregnancy slip filled out and given at the reception desk.

## 2021-07-16 ENCOUNTER — Other Ambulatory Visit: Payer: Self-pay

## 2021-07-16 ENCOUNTER — Ambulatory Visit: Payer: Medicaid Other | Admitting: Advanced Practice Midwife

## 2021-07-16 VITALS — BP 113/69 | HR 77 | Temp 98.3°F | Wt 197.6 lb

## 2021-07-16 DIAGNOSIS — O99211 Obesity complicating pregnancy, first trimester: Secondary | ICD-10-CM

## 2021-07-16 DIAGNOSIS — O09521 Supervision of elderly multigravida, first trimester: Secondary | ICD-10-CM

## 2021-07-16 DIAGNOSIS — O99210 Obesity complicating pregnancy, unspecified trimester: Secondary | ICD-10-CM | POA: Insufficient documentation

## 2021-07-16 DIAGNOSIS — O09529 Supervision of elderly multigravida, unspecified trimester: Secondary | ICD-10-CM

## 2021-07-16 DIAGNOSIS — Z550 Illiteracy and low-level literacy: Secondary | ICD-10-CM | POA: Insufficient documentation

## 2021-07-16 DIAGNOSIS — O0991 Supervision of high risk pregnancy, unspecified, first trimester: Secondary | ICD-10-CM

## 2021-07-16 DIAGNOSIS — N879 Dysplasia of cervix uteri, unspecified: Secondary | ICD-10-CM | POA: Diagnosis not present

## 2021-07-16 HISTORY — DX: Supervision of elderly multigravida, unspecified trimester: O09.529

## 2021-07-16 LAB — WET PREP FOR TRICH, YEAST, CLUE
Trichomonas Exam: NEGATIVE
Yeast Exam: NEGATIVE

## 2021-07-16 LAB — URINALYSIS
Bilirubin, UA: NEGATIVE
Glucose, UA: NEGATIVE
Ketones, UA: NEGATIVE
Leukocytes,UA: NEGATIVE
Nitrite, UA: NEGATIVE
Protein,UA: NEGATIVE
RBC, UA: NEGATIVE
Specific Gravity, UA: 1.015 (ref 1.005–1.030)
Urobilinogen, Ur: 0.2 mg/dL (ref 0.2–1.0)
pH, UA: 7 (ref 5.0–7.5)

## 2021-07-16 LAB — HEMOGLOBIN, FINGERSTICK: Hemoglobin: 11.4 g/dL (ref 11.1–15.9)

## 2021-07-16 NOTE — Progress Notes (Signed)
Hazle Coca CNM made aware of no available appointments for less than 14 weeks for either [facility] [place] or [facility].   Order changes for OB US plus 14 weeks and order re-faxed to [facility] with confirmation.   Floy Sabina, RN

## 2021-07-16 NOTE — Progress Notes (Signed)
Patient is here to began prenatal care at [facility] maternity clinic.   Patient present to appointment 45 mins after appointment time (08:45). Patient states she knew she had to be here at 0800 am.   Patient was given option to be seen by RN only and get initial interview complete and labs only - no provider, reschedule appointment, or wait to see provider if there are missed patients today.   Patient agreed to stay with visit and to see RN only and may see provider pending status of other patients.   Provider, Arnetha Courser, CNM, made aware of plan.    Provider is able to see patient today.   Floy Sabina, RN

## 2021-07-16 NOTE — Addendum Note (Signed)
Addended by: Arnetha Courser on: 07/16/2021 05:18 PM   Modules accepted: Orders

## 2021-07-16 NOTE — Addendum Note (Signed)
Addended by: Floy Sabina on: 07/16/2021 05:26 PM   Modules accepted: Orders

## 2021-07-16 NOTE — Progress Notes (Addendum)
Wet mount, urine dip, and hgb reviewed during clinic visit - no treatment indicated.   Patient aware she will receive call with US/genetic consult appointment - to be on standby for call in the next few days.  [place] referral faxed with confirmation.   Patient does have transportation issues as she does not drive or own a call - relies on 36 year old daughter or brother-in-law to drive her to appointments. Declined transportation help from [facility] stating she did not want to deal with paperwork and she can usually find help.   Patient states she does not desire QUAD screen for next MH RV.   Floy Sabina, RN

## 2021-07-16 NOTE — Progress Notes (Signed)
Per Britta Mccreedy in [facility] MFM at [facility] there are no available appointments for this patients prior to 14 weeks.  [facility] MFM and they also have no appointments for 14 weeks or less, soonest appointment is for 08/05/21 but patient will be 14 weeks and 1 day by then.   Will let provider know.   Floy Sabina, RN

## 2021-07-16 NOTE — Progress Notes (Signed)
Shirley Park [id number] [phone]  INITIAL PRENATAL VISIT NOTE  Subjective:  Shirley Park is a 35 y.o.SHF nonsmoker [id number], 55, 2) at [redacted]w[redacted]d being seen today to start prenatal care at the [facility]. She feels "good" about unplanned pregnancy with no birth control. 35 yo FOB feels "good" about pregnancy; he is the father of all her children and is working in [place] and visits her q 15 days; in supportive 18 year relationship. 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 She finished first grade only in [place] and has been in [place] x 5 years.  LMP approx 04/28/21. Has had dry cough and stuffy nose x 2 wks--encouraged covid test asap.  Denies ER use or u/s this pregnancy. Denies cigs, vaping, cigars, MJ. Last ETOH 08/2020 (3 beers) "not often". Pt cannot read or write or sign her own name and depends on her child to drive her.  She is currently monitored for the following issues for this high-risk pregnancy and has Cervical dysplasia; Illiterate: can't read or write or sign her name; Supervision of high risk pregnancy in first trimester; Obesity affecting pregnancy BMI=35.5; and Advanced maternal age in multigravida 35 yo on their problem list.  Patient reports no complaints.  Contractions: Not present.  .  Movement: Absent. Denies leaking of fluid.   Indications for ASA therapy (per uptodate) One of the following: Previous pregnancy with preeclampsia, especially early onset and with an adverse outcome No Multifetal gestation No Chronic hypertension No Type 1 or 2 diabetes mellitus No Chronic kidney disease No Autoimmune disease (antiphospholipid syndrome, systemic lupus erythematosus) No  Two or more of the following: Nulliparity No Obesity (body mass index >30 kg/m2)  Yes Family history of preeclampsia in mother or sister No Age ?25 years Yes Sociodemographic characteristics (African American race, low socioeconomic level) Yes Personal risk factors (eg, previous pregnancy with low birth weight or small for gestational age infant, previous adverse pregnancy outcome [eg, stillbirth], interval >10 years between pregnancies) No   The following portions of the patient's history were reviewed and updated as appropriate: allergies, current medications, past family history, past medical history, past social history, past surgical history and problem list. Problem list updated.  Objective:   Vitals:   07/16/21 0935  BP: 113/69  Pulse: 77  Temp: 98.3 F (36.8 C)  Weight: 197 lb 9.6 oz (89.6 kg)    Fetal Status:     Movement: Absent  Presentation: Undeterminable   Physical Exam Vitals and nursing note reviewed.  Constitutional:      General: She is not in acute distress.    Appearance: Normal appearance. She is well-developed. She is obese.  HENT:     Head: Normocephalic and atraumatic.     Right Ear: External ear normal.     Left Ear: External ear normal.     Nose: Nose normal. No congestion or rhinorrhea.     Mouth/Throat:     Lips: Pink.     Mouth: Mucous membranes are moist.     Dentition: Normal dentition. No dental caries.     Pharynx: Oropharynx is clear. Uvula midline.     Comments: Dentition: poor; has never had dental exam;urged asap Eyes:     General: No scleral icterus.    Conjunctiva/sclera: Conjunctivae normal.  Neck:     Thyroid: No thyroid mass, thyromegaly or thyroid tenderness.  Cardiovascular:     Rate and Rhythm: Normal rate.     Pulses: Normal pulses.     Comments: Extremities are warm and well perfused Pulmonary:     Effort: Pulmonary effort is normal.     Breath sounds: Normal breath sounds.  Chest:     Chest wall: No mass.  Breasts:    Tanner Score is 5.     Breasts are symmetrical.     Right: Normal. No mass,  nipple discharge or skin change.     Left: Normal. No mass, nipple discharge or skin change.  Abdominal:     Palpations: Abdomen is soft.     Tenderness: There is no abdominal tenderness.     Comments: Gravid, soft without masses or tenderness; poor tone, increased adipose, no FHR heard; difficult to assess fundal height due to increased adipose  Genitourinary:    General: Normal vulva.     Exam position: Lithotomy position.     Pubic Area: No rash.      Labia:        Right: No rash.        Left: No rash.      Vagina: Vaginal discharge (white creamy leukorrhea, ph<4.5) present.     Cervix: Normal.     Uterus: Normal. Enlarged (Gravid but difficult to assess due to increased adipose). Not tender.      Rectum: Normal. No external hemorrhoid.  Musculoskeletal:     Right lower leg: No edema.     Left lower leg: No edema.  Lymphadenopathy:     Cervical: No cervical adenopathy.     Upper Body:     Right upper body: No axillary adenopathy.     Left upper body: No axillary adenopathy.  Skin:    General: Skin is warm.     Capillary Refill: Capillary refill takes less than 2 seconds.     Findings: Rash (red rash on bilateral forearms pt states due to washing dishes--dermatitis) present.  Neurological:     Mental Status: She is alert.    Assessment and Plan:  Pregnancy: G4P3003 at [redacted]w[redacted]d  1. Illiterate: can't read or write or sign her name   2. Supervision of high risk pregnancy in first trimester MFM genetic counseling and viability/dating u/s ordered asap Early glucola for obesity Pt needs to do covid test asap due to symptoms x 2 wks (dry cough, stuffy nose) Pt was 45 min late for this apt - HIV-1/HIV-2 Qualitative RNA - Prenatal profile without Varicella/Rubella (323557) - Lead, blood (adult age 76 yrs or greater) - Glucose, 1 hour gestational - Hgb A1c w/o eAG - Comprehensive metabolic panel - Protein / creatinine ratio, urine  (Spot) - TSH - 322025 Drug Screen - WET  PREP FOR TRICH, YEAST, CLUE - Urinalysis (Urine Dip) - Hemoglobin, venipuncture - [place] MFM OB COMPLETE LESS THAN 14 WEEKS; Future - Urine Culture - HCV Ab w Reflex to Quant PCR - Chlamydia/GC NAA, Confirmation  3. Obesity affecting pregnancy in first trimester Counseled on weight gain of 11-20 lbs  4. Cervical dysplasia Needs cotest 05/2023  5. Multigravida of advanced maternal age in first trimester Genetic counseling scheduled; pt declines genetic screening    Discussed overview of care and coordination with inpatient delivery practices including [facility], [facility], [facility] and [facility].   Reviewed Centering pregnancy as standard of care at [facility]   Preterm labor symptoms and general  obstetric precautions including but not limited to vaginal bleeding, contractions, leaking of fluid and fetal movement were reviewed in detail with the patient.  Please refer to After Visit Summary for other counseling recommendations.   No follow-ups on file.  No future appointments.  Herbie Saxon, CNM

## 2021-07-17 LAB — CBC/D/PLT+RPR+RH+ABO+AB SCR
Antibody Screen: NEGATIVE
Basophils Absolute: 0.1 10*3/uL (ref 0.0–0.2)
Basos: 1 %
EOS (ABSOLUTE): 0.1 10*3/uL (ref 0.0–0.4)
Eos: 1 %
Hematocrit: 36.7 % (ref 34.0–46.6)
Hemoglobin: 11.3 g/dL (ref 11.1–15.9)
Hepatitis B Surface Ag: NEGATIVE
Immature Grans (Abs): 0 10*3/uL (ref 0.0–0.1)
Immature Granulocytes: 0 %
Lymphocytes Absolute: 1.9 10*3/uL (ref 0.7–3.1)
Lymphs: 25 %
MCH: 21.7 pg — ABNORMAL LOW (ref 26.6–33.0)
MCHC: 30.8 g/dL — ABNORMAL LOW (ref 31.5–35.7)
MCV: 70 fL — ABNORMAL LOW (ref 79–97)
Monocytes Absolute: 0.4 10*3/uL (ref 0.1–0.9)
Monocytes: 6 %
Neutrophils Absolute: 5.3 10*3/uL (ref 1.4–7.0)
Neutrophils: 67 %
Platelets: 391 10*3/uL (ref 150–450)
RBC: 5.21 x10E6/uL (ref 3.77–5.28)
RDW: 18.7 % — ABNORMAL HIGH (ref 11.7–15.4)
RPR Ser Ql: NONREACTIVE
Rh Factor: POSITIVE
WBC: 7.8 10*3/uL (ref 3.4–10.8)

## 2021-07-17 LAB — COMPREHENSIVE METABOLIC PANEL
ALT: 22 IU/L (ref 0–32)
AST: 17 IU/L (ref 0–40)
Albumin/Globulin Ratio: 1.5 (ref 1.2–2.2)
Albumin: 4.1 g/dL (ref 3.8–4.8)
Alkaline Phosphatase: 88 IU/L (ref 44–121)
BUN/Creatinine Ratio: 10 (ref 9–23)
BUN: 7 mg/dL (ref 6–20)
Bilirubin Total: 0.2 mg/dL (ref 0.0–1.2)
CO2: 21 mmol/L (ref 20–29)
Calcium: 9 mg/dL (ref 8.7–10.2)
Chloride: 103 mmol/L (ref 96–106)
Creatinine, Ser: 0.67 mg/dL (ref 0.57–1.00)
Globulin, Total: 2.8 g/dL (ref 1.5–4.5)
Glucose: 136 mg/dL — ABNORMAL HIGH (ref 70–99)
Potassium: 3.9 mmol/L (ref 3.5–5.2)
Sodium: 137 mmol/L (ref 134–144)
Total Protein: 6.9 g/dL (ref 6.0–8.5)
eGFR: 116 mL/min/{1.73_m2} (ref >59)

## 2021-07-17 LAB — HGB A1C W/O EAG: Hgb A1c MFr Bld: 5.5 % (ref 4.8–5.6)

## 2021-07-17 LAB — TSH: TSH: 3.82 u[IU]/mL (ref 0.450–4.500)

## 2021-07-17 LAB — GLUCOSE, 1 HOUR GESTATIONAL: Gestational Diabetes Screen: 131 mg/dL (ref 70–139)

## 2021-07-17 LAB — HCV AB W REFLEX TO QUANT PCR: HCV Ab: 0.1 s/co ratio (ref 0.0–0.9)

## 2021-07-17 LAB — LEAD, BLOOD (ADULT >= 16 YRS): Lead-Whole Blood: <1 ug/dL (ref 0.0–3.4)

## 2021-07-17 LAB — HCV INTERPRETATION

## 2021-07-18 LAB — HIV-1/HIV-2 QUALITATIVE RNA
HIV-1 RNA, Qualitative: NONREACTIVE
HIV-2 RNA, Qualitative: NONREACTIVE

## 2021-07-18 LAB — CHLAMYDIA/GC NAA, CONFIRMATION
Chlamydia trachomatis, NAA: NEGATIVE
Neisseria gonorrhoeae, NAA: NEGATIVE

## 2021-07-18 LAB — 789231 7+OXYCODONE-BUND
Amphetamines, Urine: NEGATIVE ng/mL
BENZODIAZ UR QL: NEGATIVE ng/mL
Barbiturate screen, urine: NEGATIVE ng/mL
Cannabinoid Quant, Ur: NEGATIVE ng/mL
Cocaine (Metab.): NEGATIVE ng/mL
OPIATE SCREEN URINE: NEGATIVE ng/mL
Oxycodone/Oxymorphone, Urine: NEGATIVE ng/mL
PCP Quant, Ur: NEGATIVE ng/mL

## 2021-07-18 LAB — PROTEIN / CREATININE RATIO, URINE
Creatinine, Urine: 56.6 mg/dL
Protein, Ur: 7.2 mg/dL
Protein/Creat Ratio: 127 mg/g creat (ref 0–200)

## 2021-07-20 LAB — URINE CULTURE: Organism ID, Bacteria: NO GROWTH

## 2021-07-23 ENCOUNTER — Telehealth: Payer: Self-pay

## 2021-07-23 NOTE — Telephone Encounter (Signed)
TC to patient to find out if she is willing to go to [facility] for her U/S appointment. Per Britta Mccreedy at [facility] in [place], there are no U/S appointments until after Christmas . LM for patient, with number to call. [address], [place] #[id number].Marland KitchenBurt Knack, RN

## 2021-07-24 NOTE — Telephone Encounter (Signed)
TC to patient to find out if willing and able to go to [facility] for U/S and patient states she doesn't have any transportation to [place]. Per Britta Mccreedy at [facility] scheduling there are no appts available in [facility] before Christmas. Secure chat to RN, Roxy Horseman to find out if she has any cancellations to squeeze this patient in for an U/S in [place]. Patient informed that we will try to get her a local appointment and will call her next week. Patient states understanding. [address], [place] ID# [id number].Marland KitchenBurt Knack, RN

## 2021-08-01 ENCOUNTER — Telehealth: Payer: Self-pay

## 2021-08-01 NOTE — Telephone Encounter (Signed)
See other phone notes.

## 2021-08-01 NOTE — Telephone Encounter (Signed)
Patient scheduled for [facility] on 08/06/21 at 0800, to arrive 7:45am. GC at 0900. TC to patient, with interpreter, M. Yemen, to inform of appointment place and time. Patient states she understands and wants to write it down. Patient had daughter come to the phone and write down the information. Patient states understanding.Marland KitchenMarland KitchenBurt Knack, RN

## 2021-08-06 ENCOUNTER — Other Ambulatory Visit: Payer: Self-pay | Admitting: Advanced Practice Midwife

## 2021-08-06 ENCOUNTER — Other Ambulatory Visit: Payer: Self-pay

## 2021-08-06 ENCOUNTER — Ambulatory Visit: Payer: Medicaid Other

## 2021-08-06 ENCOUNTER — Ambulatory Visit: Payer: Medicaid Other | Attending: Obstetrics

## 2021-08-06 DIAGNOSIS — E669 Obesity, unspecified: Secondary | ICD-10-CM | POA: Insufficient documentation

## 2021-08-06 DIAGNOSIS — O0992 Supervision of high risk pregnancy, unspecified, second trimester: Secondary | ICD-10-CM | POA: Diagnosis present

## 2021-08-06 DIAGNOSIS — Z3A08 8 weeks gestation of pregnancy: Secondary | ICD-10-CM

## 2021-08-06 DIAGNOSIS — O0991 Supervision of high risk pregnancy, unspecified, first trimester: Secondary | ICD-10-CM

## 2021-08-06 DIAGNOSIS — O09521 Supervision of elderly multigravida, first trimester: Secondary | ICD-10-CM | POA: Insufficient documentation

## 2021-08-06 DIAGNOSIS — O99211 Obesity complicating pregnancy, first trimester: Secondary | ICD-10-CM | POA: Insufficient documentation

## 2021-08-07 ENCOUNTER — Encounter: Payer: Self-pay | Admitting: Advanced Practice Midwife

## 2021-08-08 ENCOUNTER — Encounter: Payer: Self-pay | Admitting: Family Medicine

## 2021-08-09 ENCOUNTER — Encounter: Payer: Self-pay | Admitting: Nurse Practitioner

## 2021-08-09 ENCOUNTER — Ambulatory Visit: Payer: Medicaid Other | Admitting: Nurse Practitioner

## 2021-08-09 ENCOUNTER — Other Ambulatory Visit: Payer: Self-pay

## 2021-08-09 VITALS — BP 105/65 | HR 72 | Temp 97.7°F | Wt 194.0 lb

## 2021-08-09 DIAGNOSIS — O0991 Supervision of high risk pregnancy, unspecified, first trimester: Secondary | ICD-10-CM | POA: Diagnosis not present

## 2021-08-09 DIAGNOSIS — O99211 Obesity complicating pregnancy, first trimester: Secondary | ICD-10-CM

## 2021-08-09 NOTE — Progress Notes (Addendum)
[facility]  PRENATAL VISIT NOTE  Subjective:  Shirley Park is a 35 y.o. [phone] at [redacted]w[redacted]d being seen today for ongoing prenatal care.  She is currently monitored for the following issues for this high-risk pregnancy and has Cervical dysplasia; Illiterate: can't read or write or sign her name; Supervision of high risk pregnancy in first trimester; Obesity affecting pregnancy BMI=35.5; and Advanced maternal age in multigravida 35 yo on their problem list.  Patient reports no complaints.  Contractions: Not present. Vag. Bleeding: None.  Movement: Absent. Denies leaking of fluid/ROM.   The following portions of the patient's history were reviewed and updated as appropriate: allergies, current medications, past family history, past medical history, past social history, past surgical history and problem list. Problem list updated.  Objective:   Vitals:   08/09/21 0904  BP: 105/65  Pulse: 72  Temp: 97.7 F (36.5 C)  Weight: 194 lb (88 kg)    Fetal Status: Fetal Heart Rate (bpm): not heard  Fundal Height: 7 cm Movement: Absent  Presentation: Undeterminable  General:  Alert, oriented and cooperative. Patient is in no acute distress.  Skin: Skin is warm and dry. No rash noted.   Cardiovascular: Normal heart rate noted  Respiratory: Normal respiratory effort, no problems with respiration noted  Abdomen: Soft, gravid, appropriate for gestational age.  Pain/Pressure: Absent     Pelvic: Cervical exam deferred        Extremities: Normal range of motion.  Edema: None  Mental Status: Normal mood and affect. Normal behavior. Normal judgment and thought content.   Assessment and Plan:  Pregnancy: G4P3003 at [redacted]w[redacted]d  1. Supervision of high risk pregnancy in first trimester -35 year old female in clinic today for routine prenatal.  Patient is currently [redacted]w[redacted]d today. -Agrees to PNV today. -Has U/S on 08/20/21 with [facility].  2. Obesity affecting  pregnancy in first trimester -Encouraged frequent exercises.  Will initiate Aspirin 81 mg po daily at 11-12 weeks.     Term labor symptoms and general obstetric precautions including but not limited to vaginal bleeding, contractions, leaking of fluid and fetal movement were reviewed in detail with the patient. Please refer to After Visit Summary for other counseling recommendations.   Due to language barrier, an interpreter Roddie Mc Yemen) was used during the provider portion of the visit.    Return in about 4 weeks (around 09/06/2021) for Routine prenatal care visit.  Future Appointments  Date Time Provider Department Center  08/20/2021  2:00 PM [facility] US1 [facility]  08/20/2021  3:00 PM [facility] GENETIC RM [facility] None    Glenna Fellows, FNP

## 2021-08-09 NOTE — Progress Notes (Signed)
Patient here for MH RV at 7 4/7. Aware of [facility] follow-up on 08/20/2021.Marland KitchenBurt Knack, RN

## 2021-08-15 ENCOUNTER — Other Ambulatory Visit: Payer: Self-pay

## 2021-08-15 DIAGNOSIS — O09521 Supervision of elderly multigravida, first trimester: Secondary | ICD-10-CM

## 2021-08-15 DIAGNOSIS — N879 Dysplasia of cervix uteri, unspecified: Secondary | ICD-10-CM

## 2021-08-15 DIAGNOSIS — Z550 Illiteracy and low-level literacy: Secondary | ICD-10-CM

## 2021-08-16 ENCOUNTER — Encounter: Payer: Self-pay | Admitting: Advanced Practice Midwife

## 2021-08-20 ENCOUNTER — Ambulatory Visit: Payer: Self-pay

## 2021-08-20 ENCOUNTER — Other Ambulatory Visit: Payer: Self-pay

## 2021-08-20 ENCOUNTER — Telehealth: Payer: Self-pay

## 2021-08-20 ENCOUNTER — Ambulatory Visit: Payer: Self-pay | Attending: Maternal & Fetal Medicine

## 2021-08-20 DIAGNOSIS — O09521 Supervision of elderly multigravida, first trimester: Secondary | ICD-10-CM

## 2021-08-20 DIAGNOSIS — O021 Missed abortion: Secondary | ICD-10-CM | POA: Insufficient documentation

## 2021-08-20 DIAGNOSIS — O09522 Supervision of elderly multigravida, second trimester: Secondary | ICD-10-CM | POA: Insufficient documentation

## 2021-08-20 DIAGNOSIS — Z550 Illiteracy and low-level literacy: Secondary | ICD-10-CM

## 2021-08-20 DIAGNOSIS — N879 Dysplasia of cervix uteri, unspecified: Secondary | ICD-10-CM

## 2021-08-20 DIAGNOSIS — O368320 Maternal care for abnormalities of the fetal heart rate or rhythm, second trimester, not applicable or unspecified: Secondary | ICD-10-CM | POA: Insufficient documentation

## 2021-08-20 DIAGNOSIS — Z3A16 16 weeks gestation of pregnancy: Secondary | ICD-10-CM | POA: Insufficient documentation

## 2021-08-20 DIAGNOSIS — O99212 Obesity complicating pregnancy, second trimester: Secondary | ICD-10-CM | POA: Insufficient documentation

## 2021-08-20 NOTE — Progress Notes (Signed)
[name] was seen at [facility] at [facility] today which showed a nonviable pregnancy.  No growth since her visit two weeks ago and no cardiac activity.  Dr. Grace Bushy spoke with her via speaker phone with the aid of a Spanish interpreter.  I was also present for this conversation.  See his note for details.  The patient opted for expectant management and declined medication or D&E at this time, though if she were to end up with a D&E, we mentioned the possibility of genetic testing.  I would like to speak with her further prior to ordering that option if she needs a D&E.    All of her questions were answered by Dr. Grace Bushy and I spoke with Burt Knack at [facility] about a plan for a visit next week.  The patient has an appointment at [facility] on Thursday, 08/29/21 at 1pm.  If she starts bleeding spontaneously prior to that visit, she should call the [facility] at [phone] to discuss if she needs to go in or not.  Dr. Grace Bushy also instructed if she were to have severe cramping or bleeding to go to the emergency room. I called the patient and she is aware of this visit on 12/29.  Though we talked briefly about possible causes (including genetic etiologies) for miscarriage, we did not go into detail about that during this visit today.  The visit was focused primarily on the results of the ultrasound and plans for management.  The patient was appropriately sad and stated that she felt she could convey the information to her partner.  She declined our offer to speak with him or another support person by phone.  We provided her with our contact information should she need to reach out clinic but encouraged her first to reach out to her OB/Gyn.  We may be reached at [phone].  Cherly Anderson, MS, CGC   Note:  Should genetic testing be needed on the products of conception, we would recommend [facility] POC microarray [phone]) and maternal cell contamination (#[id number]).

## 2021-08-20 NOTE — Telephone Encounter (Signed)
TC from Katrina Stack, genetic counselor, at [facility], who states patient had u/s today with no fetal heartbeat. Dr. Grace Bushy has discussed management options for failed pregnancy and patient has chosen to continue with expectant mgmt at this time, per D. Wells. Per Dr. Grace Bushy, she will need to be seen in 1 week. Patient scheduled to come for follow-up on 08/29/2021. Patient to come for appointment whether or not bleeding starts, per Dr. Alvester Morin.Burt Knack, RN

## 2021-08-29 ENCOUNTER — Other Ambulatory Visit: Payer: Self-pay

## 2021-08-29 ENCOUNTER — Encounter: Payer: Self-pay | Admitting: Nurse Practitioner

## 2021-08-29 ENCOUNTER — Ambulatory Visit: Payer: Medicaid Other | Admitting: Nurse Practitioner

## 2021-08-29 VITALS — BP 122/77 | HR 81 | Temp 97.9°F | Wt 195.8 lb

## 2021-08-29 DIAGNOSIS — O021 Missed abortion: Secondary | ICD-10-CM

## 2021-08-29 HISTORY — DX: Missed abortion: O02.1

## 2021-08-29 MED ORDER — MISOPROSTOL 200 MCG PO TABS: ORAL_TABLET | ORAL | 1 refills | Status: DC

## 2021-08-29 NOTE — Progress Notes (Signed)
S: Pt here for f/u incomplete abortion. At genetic counseling visit had TVUS 08/06/21 showing [redacted]w[redacted]d IUP with slow FHR in 70s. F/u US 2 wks later showed [redacted]w[redacted]d IUP with no FHR and was counseled re: options for failed pregnancy, including expectant management, surgical and medical interventions. Pt elected expectant management here today. Has not had quant Bhcg level. In interim has not had any bleeding, and only very minor cramping. Emotionally, has accepted pregnancy loss with good support from husband and family. Has 3 children. Does hope for another pregnancy. Feels ready to return to work. O: Calm-appearing woman, smiles but subdued. No acute distress. A: missed Ab P: Reviewed options with pt, to include expectant management/watchful waiting, medical management, and surgical management. Pt opted for medical management. 1. Missed abortion with fetal demise before 20 completed weeks of gestation Discussed with Ralene Bathe MD. Plan to take oral and intravaginal misoprostol tonight (800mg  total dose, split to avoid GI upset) and repeat in 48 hours if no significant bleeding/passage of tissue. May use ibuprofen prn pain/cramping. Seek care if unacceptable level of pain/bleeding. F/u in 2 weeks to reasses sx and check quant Bhcg level. Will need to reassess family planning, as pt desires pregnancy. Still taking PNV.  - misoprostol (CYTOTEC) 200 MCG tablet; Place two tablets in the vagina tonight. May repeat in 48 hours if no significant bleeding/passage of tissue.  Dispense: 2 tablet; Refill: 1 - misoprostol (CYTOTEC) 200 MCG tablet; Take two tablets by mouth tonight. May repeat in 48 hours if no significant bleeding/passage of tissue.  Dispense: 2 tablet; Refill: 1

## 2021-08-29 NOTE — Progress Notes (Signed)
Patient here today for her OB problem visit. Salli Real interpreted the visit today. Next appointment is 09/12/2021 at 8:20 (arrival 8 am).

## 2021-09-01 NOTE — L&D Delivery Note (Signed)
Delivery Note  First Stage: Labor onset: 0800 Augmentation : cytotec x 2, pitocin, AROM Analgesia /Anesthesia intrapartum: none AROM at 1043  Second Stage: Complete dilation at 1209 Onset of pushing at 1210 FHR second stage Cat II, recurrent variable decelerations  Delivery of a viable female infant 06/19/2022 at 1212 by Shirley Park, CNM delivery of fetal head in OA position with restitution to ROT with right nuchal hand. No nuchal cord;  Anterior then posterior shoulders delivered easily with gentle downward traction. Baby placed on mom's chest, and attended to by peds.  Cord double clamped after cessation of pulsation, cut by CNM  Third Stage: Placenta delivered Shirley Park intact with 3 VC @ 1224 Placenta disposition: discarded Placenta noted to have circumvallate circumference Uterine tone firm / bleeding scant  No laceration identified  Anesthesia for repair: n/a Repair n/a Est. Blood Loss (mL): 09NA  Complications: none  Mom to postpartum.  Baby to Couplet care / Skin to Skin.  Newborn: Birth Weight: TBD, infant skin-to-skin  Apgar Scores: 8, 9 Feeding planned: breast and bottle

## 2021-09-06 ENCOUNTER — Other Ambulatory Visit: Payer: Self-pay

## 2021-09-06 ENCOUNTER — Emergency Department: Payer: Self-pay

## 2021-09-06 ENCOUNTER — Telehealth: Payer: Self-pay

## 2021-09-06 ENCOUNTER — Emergency Department
Admission: EM | Admit: 2021-09-06 | Discharge: 2021-09-06 | Disposition: A | Discharge status: Home / Self Care | Payer: Self-pay | Attending: Emergency Medicine | Admitting: Emergency Medicine

## 2021-09-06 ENCOUNTER — Encounter: Payer: Self-pay | Admitting: Emergency Medicine

## 2021-09-06 DIAGNOSIS — O039 Complete or unspecified spontaneous abortion without complication: Secondary | ICD-10-CM | POA: Insufficient documentation

## 2021-09-06 DIAGNOSIS — N9489 Other specified conditions associated with female genital organs and menstrual cycle: Secondary | ICD-10-CM | POA: Insufficient documentation

## 2021-09-06 DIAGNOSIS — O209 Hemorrhage in early pregnancy, unspecified: Secondary | ICD-10-CM

## 2021-09-06 DIAGNOSIS — Z8759 Personal history of other complications of pregnancy, childbirth and the puerperium: Secondary | ICD-10-CM

## 2021-09-06 LAB — COMPREHENSIVE METABOLIC PANEL WITH GFR
ALT: 19 U/L (ref 0–44)
AST: 20 U/L (ref 15–41)
Albumin: 3.7 g/dL (ref 3.5–5.0)
Alkaline Phosphatase: 76 U/L (ref 38–126)
Anion gap: 7 (ref 5–15)
BUN: 7 mg/dL (ref 6–20)
CO2: 24 mmol/L (ref 22–32)
Calcium: 9 mg/dL (ref 8.9–10.3)
Chloride: 106 mmol/L (ref 98–111)
Creatinine, Ser: 0.49 mg/dL (ref 0.44–1.00)
GFR, Estimated: >60 mL/min
Glucose, Bld: 90 mg/dL (ref 70–99)
Potassium: 3.5 mmol/L (ref 3.5–5.1)
Sodium: 137 mmol/L (ref 135–145)
Total Bilirubin: 0.3 mg/dL (ref 0.3–1.2)
Total Protein: 7.7 g/dL (ref 6.5–8.1)

## 2021-09-06 LAB — CBC
HCT: 33.7 % — ABNORMAL LOW (ref 36.0–46.0)
Hemoglobin: 10.4 g/dL — ABNORMAL LOW (ref 12.0–15.0)
MCH: 22.5 pg — ABNORMAL LOW (ref 26.0–34.0)
MCHC: 30.9 g/dL (ref 30.0–36.0)
MCV: 72.8 fL — ABNORMAL LOW (ref 80.0–100.0)
Platelets: 427 10*3/uL — ABNORMAL HIGH (ref 150–400)
RBC: 4.63 MIL/uL (ref 3.87–5.11)
RDW: 18.2 % — ABNORMAL HIGH (ref 11.5–15.5)
WBC: 10.9 10*3/uL — ABNORMAL HIGH (ref 4.0–10.5)
nRBC: 0 % (ref 0.0–0.2)

## 2021-09-06 LAB — HCG, QUANTITATIVE, PREGNANCY: hCG, Beta Chain, Quant, S: 9542 m[IU]/mL — ABNORMAL HIGH (ref <5)

## 2021-09-06 MED ORDER — HYDROCODONE-ACETAMINOPHEN 5-325 MG PO TABS
1.0000 | ORAL_TABLET | Freq: Once | ORAL | Status: AC
  Administered 2021-09-06: 1 via ORAL
  Filled 2021-09-06: qty 1

## 2021-09-06 MED ORDER — TRAMADOL HCL 50 MG PO TABS: 50.0000 mg | ORAL_TABLET | Freq: Four times a day (QID) | ORAL | 0 refills | Status: DC | PRN

## 2021-09-06 NOTE — Discharge Instructions (Signed)
Return if you have fever or worsening abdominal pain

## 2021-09-06 NOTE — Telephone Encounter (Signed)
Call received from [place] as client walked in requesting to be seen. Client seen in agency 08/29/2021 and prescribed Misoprostol. Client reports took as instructed by 08/30/2021 and soon began bleeding and thinks she passed the fetus. Client states she is having heavy bleeding with clots and having "so much pain I can hardly stand it". Client referred to ED for evaluation and E. Sciora CNM in agreement with plan. Client states she will go to hospital. Ms. Ferrel Logan interpreted. Jossie Ng, RN

## 2021-09-06 NOTE — ED Provider Triage Note (Signed)
Emergency Medicine Provider Triage Evaluation Note  Shirley Park , a 35 y.o. female  was evaluated in triage.  Pt complains of abdominal cramping and worsening of her vaginal bleeding with clots.  Patient was given methotrexate 08/29/2021.  Patient called the clinic but was told to come to the emergency department.  Patient going through 6-7 pads per day.  Review of Systems  Positive: Abdominal pain.vaginal bleeding Negative: No fever, N/V/D  Physical Exam  BP (!) 142/97 (BP Location: Left Arm)    Pulse 80    Temp 97.8 F (36.6 C) (Oral)    Resp 20    Wt 88.5 kg    LMP  (LMP Unknown)    SpO2 100%    Breastfeeding Unknown    BMI 39.39 kg/m  Gen:   Awake, no distress   Resp:  Normal effort lungs are clear bilaterally.  Heart regular rate and rhythm. MSK:   Moves extremities without difficulty  Other:    Medical Decision Making  Medically screening exam initiated at 2:05 PM.  Appropriate orders placed.  Jorga Perricone was informed that the remainder of the evaluation will be completed by another provider, this initial triage assessment does not replace that evaluation, and the importance of remaining in the ED until their evaluation is complete.     Johnn Hai, PA-C 09/06/21 1410

## 2021-09-06 NOTE — ED Provider Notes (Signed)
[facility] Provider Note    Event Date/Time   First MD Initiated Contact with Patient 09/06/21 1826     (approximate)   History   Abdominal Pain   HPI  Shirley Park is a 36 y.o. female presents with lower abdominal pain, patient had a miscarriage on 12/29 and was given Cytotec to pass the fetus.  Patient states that yesterday she had a gush of fluid like she had broken her water and then the fetus material passed.  Is concerned because she will have cramps and passed a few clots.  This is been ongoing for 1 day.  She denies any fever or chills.  Is not bleeding through more than 1 pad per hour.  States it is very light bleeding until she has a cramp and then she will pass a clot.      Physical Exam   Triage Vital Signs: ED Triage Vitals  Enc Vitals Group     BP 09/06/21 1400 (!) 142/97     Pulse Rate 09/06/21 1400 80     Resp 09/06/21 1400 20     Temp 09/06/21 1400 97.8 F (36.6 C)     Temp Source 09/06/21 1400 Oral     SpO2 09/06/21 1400 100 %     Weight 09/06/21 1401 195 lb (88.5 kg)     Height --      Head Circumference --      Peak Flow --      Pain Score 09/06/21 1401 10     Pain Loc --      Pain Edu? --      Excl. in GC? --     Most recent vital signs: Vitals:   09/06/21 1400  BP: (!) 142/97  Pulse: 80  Resp: 20  Temp: 97.8 F (36.6 C)  SpO2: 100%     General: Awake, no distress.   CV:  Good peripheral perfusion.   Resp:  Normal effort.   Abd:  No distention.  Slightly tender at suprapubic area Other:      ED Results / Procedures / Treatments   Labs (all labs ordered are listed, but only abnormal results are displayed) Labs Reviewed  CBC - Abnormal; Notable for the following components:      Result Value   WBC 10.9 (*)    Hemoglobin 10.4 (*)    HCT 33.7 (*)    MCV 72.8 (*)    MCH 22.5 (*)    RDW 18.2 (*)    Platelets 427 (*)    All other components within normal limits  HCG, QUANTITATIVE, PREGNANCY -  Abnormal; Notable for the following components:   hCG, Beta Chain, Quant, S 9,542 (*)    All other components within normal limits  COMPREHENSIVE METABOLIC PANEL  URINALYSIS, COMPLETE (UACMP) WITH MICROSCOPIC  POC URINE PREG, ED     EKG     RADIOLOGY  Ultrasound OB less than 14-week   PROCEDURES:  Critical Care performed: No  Procedures   MEDICATIONS ORDERED IN ED: Medications  HYDROcodone-acetaminophen (NORCO/VICODIN) 5-325 MG per tablet 1 tablet (has no administration in time range)     IMPRESSION / MDM / ASSESSMENT AND PLAN / ED COURSE  I reviewed the triage vital signs and the nursing notes.                              Differential diagnosis includes, but is not limited  to,bleeding due to miscarriage, retained products of pregnancy, hemorrhaging secondary to miscarriage, ectopic  Patient is a 36 year old female presents following a miscarriage and lower abdominal pain.  Patient does have an appointment follow-up with her doctor on 09/15/2021.  She still denies fever or chills.  Ordered labs, CBC has a WBC of 10.9, H&H are slightly depressed at 10.4 and 33.7, comprehensive metabolic panel is normal, beta-hCG is still elevated at 9542.   Ultrasound does not show an IUP.  This was reviewed by me.  Radiologist does comment that the endometrium is a little thickened.  I do feel this may be due to the current passing of the fetus and still continued to pass blood clots.  Feel that since she is afebrile and does not have continuous pain she can follow-up with her regular doctor.  She will need serial beta hCGs.  However I did give her strict instructions to return if she develops fever, has worsening abdominal pain, or is bleeding heavier and is bleeding through a pad per hour.  The patient is in agreement with the treatment plan.  She is given pain medication and discharged in stable condition.         FINAL CLINICAL IMPRESSION(S) / ED DIAGNOSES   Final diagnoses:   Miscarriage     Rx / DC Orders   ED Discharge Orders          Ordered    traMADol (ULTRAM) 50 MG tablet  Every 6 hours PRN        09/06/21 1845             Note:  This document was prepared using Dragon voice recognition software and may include unintentional dictation errors.    Faythe Ghee, PA-C 09/06/21 1855    Georga Hacking, MD 09/06/21 Jerene Bears

## 2021-09-06 NOTE — ED Triage Notes (Signed)
Pt in with co lower abd pain states had miscarriage 12/29 and bleeding has worsened today. Also co worsening lower abd pain.

## 2021-09-12 ENCOUNTER — Other Ambulatory Visit: Payer: Self-pay

## 2021-09-12 ENCOUNTER — Encounter: Payer: Self-pay | Admitting: Nurse Practitioner

## 2021-09-12 ENCOUNTER — Ambulatory Visit: Payer: Self-pay | Admitting: Nurse Practitioner

## 2021-09-12 VITALS — BP 101/65 | HR 74 | Temp 98.2°F | Wt 195.0 lb

## 2021-09-12 DIAGNOSIS — O039 Complete or unspecified spontaneous abortion without complication: Secondary | ICD-10-CM

## 2021-09-12 NOTE — Progress Notes (Signed)
Patient in maternity health clinic today for a follow up after a miscarriage.  Patient had a miscarriage on 08/29/21 and was given Cytotec to assist with passing of the fetal tissue.  She presented to the health department on 09/06/2020 with severe cramping and passing clots and was then referred to the emergency room.  Ultrasound performed on 09/06/2020 that showed that there was no intrauterine pregnancy visualized.  Beta HCG also obtained levels at 9,542.  Will obtain a Beta HCG today.  Will have follow up in another 2 weeks.  Glenna Fellows, FNP   Due to language barrier, an interpreter was present during the provider portion of the visit Salli Real).  Glenna Fellows, FNP

## 2021-09-13 LAB — BETA HCG QUANT (REF LAB): hCG Quant: 183 m[IU]/mL

## 2021-09-27 ENCOUNTER — Other Ambulatory Visit: Payer: Self-pay

## 2021-09-27 ENCOUNTER — Ambulatory Visit: Payer: Self-pay | Admitting: Family Medicine

## 2021-09-27 VITALS — BP 117/76 | HR 89 | Temp 97.5°F | Wt 194.2 lb

## 2021-09-27 DIAGNOSIS — O021 Missed abortion: Secondary | ICD-10-CM

## 2021-09-27 NOTE — Progress Notes (Signed)
[facility] Department MISCARRIAGE follow up  Subjective:   Shirley Park is a 36 y.o. [phone] female here for  F/u after miscarriage at 64 wk6d. She is s/p cytotec and ahs [place] on 1/6 that showed passage of gestational sac and fetus. There was a concern for possible retained products. She continued to have bleeding and cramping until 1/13 but this has stopped.  Denies continued bleeding/ cramping.   Date  BHCG  09/06/21 9542  09/12/21 183   The following portions of the patient's history were reviewed and updated as appropriate: allergies, current medications, past family history, past medical history, past social history, past surgical history and problem list.  Review of Systems Pertinent items are noted in HPI.   Objective:  BP 117/76    Pulse 89    Temp (!) 97.5 F (36.4 C)    Wt 194 lb 3.2 oz (88.1 kg)    LMP  (LMP Unknown)    BMI 39.22 kg/m  Gen: well appearing, NAD HEENT: no scleral icterus CV: RR Lung: Normal WOB Ext: warm well perfused  Lab Results  Component Value Date   ABORH B POS 02/13/2019     Assessment and Plan:   1. Missed abortion with fetal demise before 20 completed weeks of gestation - Beta hCG quant (ref lab) - Counseled about preconception health - Patient desires a pregnancy and would like to try again as soon as possible.  - Discussed waiting until after first period and using contraception until that time - No need for rhogam given patient is Rh +   Please refer to After Visit Summary for other counseling recommendations.   No follow-ups on file.

## 2021-09-27 NOTE — Progress Notes (Signed)
Patient here for 2 week follow-up after SAB.Marland KitchenBurt Knack, RN

## 2021-09-28 LAB — BETA HCG QUANT (REF LAB): hCG Quant: 6 m[IU]/mL

## 2021-09-30 ENCOUNTER — Telehealth: Payer: Self-pay

## 2021-09-30 NOTE — Telephone Encounter (Signed)
Per result note from Dr Alvester Morin, client's beta hcg from 09/27/2021 is lower than previous level and needs repeat beta hcg this week. Call to client with Marlene Yemen with above information. Number to schedule appt provided. Jossie Ng, RN

## 2021-10-02 NOTE — Telephone Encounter (Addendum)
Phone call to patient to schedule follow up needed Bhcg lab per result note of Dr. Ernestina Patches. Patient scheduled for above lab draw on 10/07/2021 @ 8:20. To arrive at 8:00 for check in. Interpretation assistance by M. [place]), Environmental consultant. Hal Morales, RN

## 2021-10-07 ENCOUNTER — Other Ambulatory Visit: Payer: Self-pay

## 2021-10-07 DIAGNOSIS — O021 Missed abortion: Secondary | ICD-10-CM

## 2021-10-07 NOTE — Progress Notes (Signed)
In Nurse Clinic for Beta HCG. M Yemen, interpreter. RN walked pt to lab. Pt plans to talk with finance about hospital bills she has received after blood draw. Jerel Shepherd, RN

## 2021-10-08 LAB — BETA HCG QUANT (REF LAB): hCG Quant: 1 m[IU]/mL

## 2021-10-21 ENCOUNTER — Telehealth: Payer: Self-pay

## 2021-10-21 NOTE — Telephone Encounter (Signed)
Client walked in this am requesting result of last beta hcg test. Client provided result and counseled no additional beta hcg testing needed at this time. Rosamaria Lints interpreted during conversation with client. Rich Number, RN

## 2021-12-13 ENCOUNTER — Ambulatory Visit (LOCAL_COMMUNITY_HEALTH_CENTER): Payer: Self-pay

## 2021-12-13 VITALS — BP 114/78 | Ht 61.5 in | Wt 195.0 lb

## 2021-12-13 DIAGNOSIS — Z3201 Encounter for pregnancy test, result positive: Secondary | ICD-10-CM

## 2021-12-13 LAB — PREGNANCY, URINE: Preg Test, Ur: POSITIVE — AB

## 2021-12-13 MED ORDER — PRENATAL VITAMINS 28-0.8 MG PO TABS: 28.0000 mg | ORAL_TABLET | Freq: Every day | ORAL | 0 refills | Status: DC

## 2021-12-13 NOTE — Progress Notes (Signed)
UPT positive.  Plans prenatal care at [facility].  LMP 10/03/21 and states no menses in March.  Pt. referred to clerk for prenatal appt.  ? ?Pregnancy information given and reviewed with pt.  ? ?Interpreter services provided initially by language line .Marland KitchenMarland KitchenTaide id # [id number], and then Marlena Yemen.Cherlynn Polo, RN  ?

## 2021-12-30 ENCOUNTER — Encounter: Payer: Self-pay | Admitting: Nurse Practitioner

## 2021-12-30 ENCOUNTER — Ambulatory Visit: Payer: Medicaid Other | Admitting: Nurse Practitioner

## 2021-12-30 ENCOUNTER — Other Ambulatory Visit: Payer: Self-pay

## 2021-12-30 VITALS — BP 107/74 | HR 82 | Temp 96.7°F | Wt 192.0 lb

## 2021-12-30 DIAGNOSIS — O0990 Supervision of high risk pregnancy, unspecified, unspecified trimester: Secondary | ICD-10-CM | POA: Insufficient documentation

## 2021-12-30 DIAGNOSIS — O99210 Obesity complicating pregnancy, unspecified trimester: Secondary | ICD-10-CM

## 2021-12-30 DIAGNOSIS — O09521 Supervision of elderly multigravida, first trimester: Secondary | ICD-10-CM

## 2021-12-30 DIAGNOSIS — O09529 Supervision of elderly multigravida, unspecified trimester: Secondary | ICD-10-CM

## 2021-12-30 DIAGNOSIS — O99211 Obesity complicating pregnancy, first trimester: Secondary | ICD-10-CM

## 2021-12-30 DIAGNOSIS — O99011 Anemia complicating pregnancy, first trimester: Secondary | ICD-10-CM

## 2021-12-30 DIAGNOSIS — Z550 Illiteracy and low-level literacy: Secondary | ICD-10-CM

## 2021-12-30 DIAGNOSIS — O0991 Supervision of high risk pregnancy, unspecified, first trimester: Secondary | ICD-10-CM

## 2021-12-30 DIAGNOSIS — O021 Missed abortion: Secondary | ICD-10-CM

## 2021-12-30 HISTORY — DX: Supervision of high risk pregnancy, unspecified, unspecified trimester: O09.90

## 2021-12-30 HISTORY — DX: Anemia complicating pregnancy, first trimester: O99.011

## 2021-12-30 LAB — URINALYSIS
Bilirubin, UA: NEGATIVE
Glucose, UA: NEGATIVE
Ketones, UA: NEGATIVE
Leukocytes,UA: NEGATIVE
Nitrite, UA: NEGATIVE
Protein,UA: NEGATIVE
RBC, UA: NEGATIVE
Specific Gravity, UA: 1.01 (ref 1.005–1.030)
Urobilinogen, Ur: 0.2 mg/dL (ref 0.2–1.0)
pH, UA: 6.5 (ref 5.0–7.5)

## 2021-12-30 LAB — WET PREP FOR TRICH, YEAST, CLUE: Trichomonas Exam: NEGATIVE

## 2021-12-30 LAB — HEMOGLOBIN, FINGERSTICK: Hemoglobin: 8.9 g/dL — ABNORMAL LOW (ref 11.1–15.9)

## 2021-12-30 MED ORDER — IRON (FERROUS SULFATE) 325 (65 FE) MG PO TABS: 1.0000 | ORAL_TABLET | Freq: Every day | ORAL | 0 refills | Status: DC

## 2021-12-30 MED ORDER — IRON (FERROUS SULFATE) 325 (65 FE) MG PO TABS: 1.0000 | ORAL_TABLET | Freq: Three times a day (TID) | ORAL | 0 refills | Status: DC

## 2021-12-30 NOTE — Progress Notes (Signed)
Presents for initiation of prenatal care. Illiterate. TST 0 mm 07/01/2018 and denies international travel / TB exposure since that time. Client reports not taking PNV currently as causing burning in pit of stomach and dizziness (taking with food). Jossie Ng, RN ?Urine dip reviewed - WNL and no intervention needed. Wet prep with few yeast -no intervention needed per standing order. Hgb = 8.9 and Shirley Fellows FNP notified. Iron 325 mg po TID initiated per her verbal order. Anemia profile ordered today. Jossie Ng, RN ? ? ? ?

## 2021-12-30 NOTE — Progress Notes (Signed)
[facility] Health Department  Maternal Health Clinic   INITIAL PRENATAL VISIT NOTE  Subjective:  Shirley Park is a 36 y.o. [phone] at [redacted]w[redacted]d being seen today to start prenatal care at the [facility] Department.  She is currently monitored for the following issues for this high-risk pregnancy and has Cervical dysplasia; Illiterate: can't read or write or sign her name; Obesity affecting pregnancy BMI=35.5; Advanced maternal age in multigravida 36 yo; Missed abortion with fetal demise before 20 completed weeks of gestation; Anemia affecting pregnancy in first trimester; and Supervision of high risk pregnancy, antepartum on their problem list.  Patient reports no complaints.  Contractions: Not present. Vag. Bleeding: None.  Movement: Absent. Denies leaking of fluid.   Indications for ASA therapy (per uptodate) One of the following: Previous pregnancy with preeclampsia, especially early onset and with an adverse outcome No Multifetal gestation No Chronic hypertension No Type 1 or 2 diabetes mellitus No Chronic kidney disease No Autoimmune disease (antiphospholipid syndrome, systemic lupus erythematosus) No  Two or more of the following: Nulliparity No Obesity (body mass index >30 kg/m2) Yes Family history of preeclampsia in mother or sister No Age ?35 years Yes Sociodemographic characteristics (African American race, low socioeconomic level) Yes Personal risk factors (eg, previous pregnancy with low birth weight or small for gestational age infant, previous adverse pregnancy outcome [eg, stillbirth], interval >10 years between pregnancies) Yes   The following portions of the patient's history were reviewed and updated as appropriate: allergies, current medications, past family history, past medical history, past social history, past surgical history and problem list. Problem list updated.  Objective:   Vitals:   12/30/21 0911  BP: 107/74  Pulse: 82  Temp: (!)  96.7 F (35.9 C)  Weight: 192 lb (87.1 kg)    Fetal Status: Fetal Heart Rate (bpm): not heard  Fundal Height: 12 cm Movement: Absent  Presentation: Undeterminable   Physical Exam Vitals and nursing note reviewed.  Constitutional:      General: She is not in acute distress.    Appearance: Normal appearance. She is well-developed.  HENT:     Head: Normocephalic and atraumatic.     Right Ear: External ear normal.     Left Ear: External ear normal.     Nose: Nose normal. No congestion or rhinorrhea.     Mouth/Throat:     Lips: Pink.     Mouth: Mucous membranes are moist.     Dentition: Normal dentition. No dental caries.     Pharynx: Oropharynx is clear. Uvula midline.     Comments: Dentition: No visible signs of dental caries.  Eyes:     General: No scleral icterus.    Conjunctiva/sclera: Conjunctivae normal.     Pupils: Pupils are equal, round, and reactive to light.  Neck:     Thyroid: No thyroid mass or thyromegaly.  Cardiovascular:     Rate and Rhythm: Normal rate and regular rhythm.     Pulses: Normal pulses.     Comments: Extremities are warm and well perfused Pulmonary:     Effort: Pulmonary effort is normal.     Breath sounds: Normal breath sounds.  Chest:     Chest wall: No mass.  Breasts:    Tanner Score is 5.     Breasts are symmetrical.     Right: Normal. No mass, nipple discharge or skin change.     Left: Normal. No mass, nipple discharge or skin change.     Comments: Breasts:  Right: Normal. No swelling, mass, nipple discharge, skin change or tenderness.        Left: Normal. No swelling, mass, nipple discharge, skin change or tenderness.   Abdominal:     General: Abdomen is flat. Bowel sounds are normal.     Palpations: Abdomen is soft.     Tenderness: There is no abdominal tenderness.     Comments: Gravid   Genitourinary:    General: Normal vulva.     Exam position: Lithotomy position.     Pubic Area: No rash.      Labia:        Right: No  rash.        Left: No rash.      Vagina: Normal. No vaginal discharge.     Cervix: No cervical motion tenderness or friability.     Uterus: Normal. Enlarged (Gravid 12 size). Not tender.      Adnexa: Right adnexa normal and left adnexa normal.     Rectum: Normal. No external hemorrhoid.  Musculoskeletal:     Cervical back: Full passive range of motion without pain, normal range of motion and neck supple.     Right lower leg: No edema.     Left lower leg: No edema.  Lymphadenopathy:     Cervical: No cervical adenopathy.     Upper Body:     Right upper body: No axillary adenopathy.     Left upper body: No axillary adenopathy.  Skin:    General: Skin is warm and dry.     Capillary Refill: Capillary refill takes less than 2 seconds.  Neurological:     Mental Status: She is alert and oriented to person, place, and time.  Psychiatric:        Attention and Perception: Attention normal.        Mood and Affect: Mood normal.        Speech: Speech normal.        Behavior: Behavior is cooperative.    Assessment and Plan:  Pregnancy: [id number] at [redacted]w[redacted]d  1. Supervision of high risk pregnancy, antepartum -36 year old female in clinic today for prenatal care. -Patient states she is taking PNV daily, however they are making her feel sick.  Patient advised to take on a full stomach and at night.  -Patient states she is happy about the pregnancy. -Patient currently lives with her brother in-law.  She currently works at [employer].   -Father of the baby is present. -Patient is unsure of last LMP, possible 10/30/21.  Fetal heart tones also not heard today due to increase abdominal adipose tissue.  Will send patient for dating U/S. -No recent ED visit.  -PAP not performed today.  Last PAP 05/11/20 (NIL HPV negative) -UDS today.  -Patient reports being to a dentist once.  Advised patient that dental care is safe during pregnancy.  Dental list provided.   -Patient declines genetic counseling and NIPs  today.   -PHQ-9= 0 -Patient has received 2 COVID vaccines.   - TSH - Urine Culture - Chlamydia/GC NAA, Confirmation - Comprehensive metabolic panel - Glucose, 1 hour - HCV Ab w Reflex to Quant PCR - Hgb A1c w/o eAG - Lead, blood (adult age 28 yrs or greater) - Prenatal profile without Varicella or Rubella - Protein / creatinine ratio, urine - HIV-1/HIV-2 Qualitative RNA - WET PREP FOR TRICH, YEAST, CLUE - Hemoglobin, venipuncture - Urinalysis (Urine Dip) - [id number] Drug Screen - [place] MFM OB Comp Less 14 Wks; Future  2. Obesity affecting pregnancy, antepartum -Patient given resource sheet of how much weight to gain throughout pregnancy. -Patient advised to exercise frequently and decrease foods high in fat and sugar.   -Patient also given resource sheet on taking ASA 81 mg daily.  3. Antepartum multigravida of advanced maternal age -Patient 36 years old.  4. Missed abortion with fetal demise before 20 completed weeks of gestation -Patient states she had a miscarriage 08/2021.  5. Illiterate: can't read or write or sign her name -Forms and information explained in a way that she can understand.     6. Anemia affecting pregnancy in first trimester -Hgb 8.9 today.  Discussed options for patient with Dr. Lyndel Safe.  Per Dr. Alvester Morin will start patient on Iron 325 MG PO TID daily.  Patient to incorporate Iron rich foods, will assess in one month.  If no improvement Iron infusions may be an option.  - Fe+CBC/D/Plt+TIBC+Fer+Retic - Iron, Ferrous Sulfate, 325 (65 Fe) MG TABS; Take 1 tablet by mouth in the morning, at noon, and at bedtime.  Dispense: 100 tablet; Refill: 0     Discussed overview of care and coordination with inpatient delivery practices including [facility], [facility], [facility] and [facility].      Term labor symptoms and general obstetric precautions including but not limited to vaginal bleeding, contractions, leaking of fluid and fetal movement were  reviewed in detail with the patient.  Please refer to After Visit Summary for other counseling recommendations.    Due to a language barrier an interpreter Salli Real) was used for the provider portion of the visit.       Return in about 4 weeks (around 01/27/2022) for Routine prenatal care visit.  Future Appointments  Date Time Provider Department Center  01/24/2022 10:00 AM [facility] PROVIDER [facility] None    Glenna Fellows, FNP

## 2021-12-31 ENCOUNTER — Telehealth: Payer: Self-pay

## 2021-12-31 DIAGNOSIS — O99810 Abnormal glucose complicating pregnancy: Secondary | ICD-10-CM | POA: Insufficient documentation

## 2021-12-31 LAB — FE+CBC/D/PLT+TIBC+FER+RETIC
Basophils Absolute: 0.1 10*3/uL (ref 0.0–0.2)
Basos: 1 %
EOS (ABSOLUTE): 0.1 10*3/uL (ref 0.0–0.4)
Eos: 1 %
Ferritin: 5 ng/mL — ABNORMAL LOW (ref 15–150)
Hematocrit: 31.9 % — ABNORMAL LOW (ref 34.0–46.6)
Hemoglobin: 9.2 g/dL — ABNORMAL LOW (ref 11.1–15.9)
Immature Grans (Abs): 0 10*3/uL (ref 0.0–0.1)
Immature Granulocytes: 0 %
Iron Saturation: 3 % — CL (ref 15–55)
Iron: 15 ug/dL — ABNORMAL LOW (ref 27–159)
Lymphocytes Absolute: 1.9 10*3/uL (ref 0.7–3.1)
Lymphs: 23 %
MCH: 19.2 pg — ABNORMAL LOW (ref 26.6–33.0)
MCHC: 28.8 g/dL — ABNORMAL LOW (ref 31.5–35.7)
MCV: 67 fL — ABNORMAL LOW (ref 79–97)
Monocytes Absolute: 0.4 10*3/uL (ref 0.1–0.9)
Monocytes: 5 %
Neutrophils Absolute: 6.1 10*3/uL (ref 1.4–7.0)
Neutrophils: 70 %
Platelets: 447 10*3/uL (ref 150–450)
RBC: 4.78 x10E6/uL (ref 3.77–5.28)
RDW: 18.1 % — ABNORMAL HIGH (ref 11.7–15.4)
Retic Ct Pct: 1.5 % (ref 0.6–2.6)
Total Iron Binding Capacity: 535 ug/dL — ABNORMAL HIGH (ref 250–450)
UIBC: 520 ug/dL — ABNORMAL HIGH (ref 131–425)
WBC: 8.6 10*3/uL (ref 3.4–10.8)

## 2021-12-31 LAB — COMPREHENSIVE METABOLIC PANEL
ALT: 13 IU/L (ref 0–32)
AST: 13 IU/L (ref 0–40)
Albumin/Globulin Ratio: 1.4 (ref 1.2–2.2)
Albumin: 4.1 g/dL (ref 3.8–4.8)
Alkaline Phosphatase: 82 IU/L (ref 44–121)
BUN/Creatinine Ratio: 11 (ref 9–23)
BUN: 6 mg/dL (ref 6–20)
Bilirubin Total: <0.2 mg/dL (ref 0.0–1.2)
CO2: 21 mmol/L (ref 20–29)
Calcium: 9 mg/dL (ref 8.7–10.2)
Chloride: 103 mmol/L (ref 96–106)
Creatinine, Ser: 0.54 mg/dL — ABNORMAL LOW (ref 0.57–1.00)
Globulin, Total: 2.9 g/dL (ref 1.5–4.5)
Glucose: 144 mg/dL — ABNORMAL HIGH (ref 70–99)
Potassium: 3.8 mmol/L (ref 3.5–5.2)
Sodium: 137 mmol/L (ref 134–144)
Total Protein: 7 g/dL (ref 6.0–8.5)
eGFR: 122 mL/min/{1.73_m2} (ref >59)

## 2021-12-31 LAB — GLUCOSE, 1 HOUR GESTATIONAL: Gestational Diabetes Screen: 136 mg/dL (ref 70–139)

## 2021-12-31 LAB — CBC/D/PLT+RPR+RH+ABO+AB SCR
Antibody Screen: NEGATIVE
Hepatitis B Surface Ag: NEGATIVE
RPR Ser Ql: NONREACTIVE
Rh Factor: POSITIVE

## 2021-12-31 LAB — HCV AB W REFLEX TO QUANT PCR: HCV Ab: NONREACTIVE

## 2021-12-31 LAB — HCV INTERPRETATION

## 2021-12-31 LAB — TSH: TSH: 4.23 u[IU]/mL (ref 0.450–4.500)

## 2021-12-31 LAB — HGB A1C W/O EAG: Hgb A1c MFr Bld: 5.5 % (ref 4.8–5.6)

## 2021-12-31 NOTE — Telephone Encounter (Signed)
Phone call to patient to inform of abnormal 1hgtt and need of 3hgtt lab. Scheduled patient for 01/06/22 @ 0800. Instructed patient to not eat or drink anything other than sips of water after 8:00 pm the night before her appt. Informed patient she would be here 3-4 hours for a total of 4 blood draws and to remain on first floor until all blood draws are complete. Patient verbalized understanding of all of the above. M. [place], agency interpreter assisted with communication of call. Tawny Hopping, RN ? ?

## 2022-01-01 LAB — PROTEIN / CREATININE RATIO, URINE
Creatinine, Urine: 32.7 mg/dL
Protein, Ur: 8.9 mg/dL
Protein/Creat Ratio: 272 mg/g creat — ABNORMAL HIGH (ref 0–200)

## 2022-01-01 LAB — LEAD, BLOOD (ADULT >= 16 YRS): Lead-Whole Blood: <1 ug/dL (ref 0.0–3.4)

## 2022-01-01 LAB — CHLAMYDIA/GC NAA, CONFIRMATION
Chlamydia trachomatis, NAA: NEGATIVE
Neisseria gonorrhoeae, NAA: NEGATIVE

## 2022-01-01 LAB — 789231 7+OXYCODONE-BUND
Amphetamines, Urine: NEGATIVE ng/mL
BENZODIAZ UR QL: NEGATIVE ng/mL
Barbiturate screen, urine: NEGATIVE ng/mL
Cannabinoid Quant, Ur: NEGATIVE ng/mL
Cocaine (Metab.): NEGATIVE ng/mL
OPIATE SCREEN URINE: NEGATIVE ng/mL
Oxycodone/Oxymorphone, Urine: NEGATIVE ng/mL
PCP Quant, Ur: NEGATIVE ng/mL

## 2022-01-01 LAB — HIV-1/HIV-2 QUALITATIVE RNA
HIV-1 RNA, Qualitative: NONREACTIVE
HIV-2 RNA, Qualitative: NONREACTIVE

## 2022-01-01 LAB — URINE CULTURE

## 2022-01-06 ENCOUNTER — Ambulatory Visit: Payer: Medicaid Other | Admitting: Advanced Practice Midwife

## 2022-01-06 VITALS — Wt 193.6 lb

## 2022-01-06 DIAGNOSIS — O0991 Supervision of high risk pregnancy, unspecified, first trimester: Secondary | ICD-10-CM

## 2022-01-06 NOTE — Progress Notes (Signed)
Presented for 3 HR GTT. Last oral intake at 7 p.m. [facility] Spanish Interpretor present for nurse and lab visit. ?

## 2022-01-07 ENCOUNTER — Other Ambulatory Visit: Payer: Self-pay | Admitting: Nurse Practitioner

## 2022-01-07 DIAGNOSIS — O0990 Supervision of high risk pregnancy, unspecified, unspecified trimester: Secondary | ICD-10-CM

## 2022-01-07 LAB — GLUCOSE TOLERANCE TEST, 6 HOUR
Glucose, 1 Hour GTT: 137 mg/dL (ref 70–199)
Glucose, 2 hour: 153 mg/dL — ABNORMAL HIGH (ref 70–139)
Glucose, 3 hour: 111 mg/dL — ABNORMAL HIGH (ref 70–109)
Glucose, GTT - Fasting: 86 mg/dL (ref 70–99)

## 2022-01-10 ENCOUNTER — Telehealth: Payer: Self-pay

## 2022-01-10 ENCOUNTER — Other Ambulatory Visit: Payer: Self-pay | Admitting: Internal Medicine

## 2022-01-10 NOTE — Telephone Encounter (Signed)
Call to [facility] [place] scheduler to ascertain if client has [place] appt. Per Britta Mccreedy, paper referral not received (originally faxed 12/30/2021 with confirmation received and electronically ordered). Referral with snapshot pages refaxed with confirmation received. Per Britta Mccreedy, she will call ASAP with appt. Jossie Ng, RN ? ?

## 2022-01-10 NOTE — Telephone Encounter (Signed)
Client has [place] appt scheduled for 03/06/2022 at 0800 (arrive 0745). Call to client with Marlene [place] and appt given. Client aware (and noted on pink sticky) to give client written appt and Angelina at her 01/24/2022 RV appt. Rich Number, RN ? ?

## 2022-01-24 ENCOUNTER — Ambulatory Visit: Payer: Medicaid Other | Admitting: Nurse Practitioner

## 2022-01-24 VITALS — BP 104/60 | HR 76 | Temp 98.3°F | Wt 192.6 lb

## 2022-01-24 DIAGNOSIS — O99012 Anemia complicating pregnancy, second trimester: Secondary | ICD-10-CM

## 2022-01-24 DIAGNOSIS — O0992 Supervision of high risk pregnancy, unspecified, second trimester: Secondary | ICD-10-CM | POA: Diagnosis not present

## 2022-01-24 DIAGNOSIS — O99011 Anemia complicating pregnancy, first trimester: Secondary | ICD-10-CM

## 2022-01-24 DIAGNOSIS — O99210 Obesity complicating pregnancy, unspecified trimester: Secondary | ICD-10-CM

## 2022-01-24 DIAGNOSIS — O99212 Obesity complicating pregnancy, second trimester: Secondary | ICD-10-CM

## 2022-01-24 DIAGNOSIS — O0990 Supervision of high risk pregnancy, unspecified, unspecified trimester: Secondary | ICD-10-CM

## 2022-01-24 LAB — HEMOGLOBIN, FINGERSTICK: Hemoglobin: 10.9 g/dL — ABNORMAL LOW (ref 11.1–15.9)

## 2022-01-24 NOTE — Progress Notes (Signed)
Hgb reviewed during clinic visit - pt to continue taking iron as prescribed. Pt states she is taking iron TID with juice. Patient is also taking ASA 81mg  daily. SHe mentiones she changed her PNV to gummy PNV as as the ones given to her prior were causing stomach upset.   Patient declined QUAD screen - declination form signed today.   Patient aware of Ultrasound appt 6/5 and instruction, map, and directions given.   All questions answered.   , RN

## 2022-01-24 NOTE — Addendum Note (Signed)
Addended by: Heywood Bene on: 01/24/2022 02:17 PM   Modules accepted: Orders

## 2022-01-25 ENCOUNTER — Encounter: Payer: Self-pay | Admitting: Nurse Practitioner

## 2022-01-25 NOTE — Progress Notes (Signed)
[place] Department Maternal Health Clinic  PRENATAL VISIT NOTE  Subjective:  Shirley Park is a 36 y.o. [phone] at [redacted]w[redacted]d being seen today for ongoing prenatal care.  She is currently monitored for the following issues for this high-risk pregnancy and has Cervical dysplasia; Illiterate: can't read or write or sign her name; Obesity affecting pregnancy BMI=35.5; Advanced maternal age in multigravida 36 yo; Missed abortion with fetal demise before 45 completed weeks of gestation; Anemia affecting pregnancy in first trimester; Supervision of high risk pregnancy, antepartum; and Abnormal glucose affecting pregnancy on their problem list.  Patient reports no complaints.  Contractions: Not present. Vag. Bleeding: None.  Movement: Absent. Denies leaking of fluid/ROM.   The following portions of the patient's history were reviewed and updated as appropriate: allergies, current medications, past family history, past medical history, past social history, past surgical history and problem list. Problem list updated.  Objective:   Vitals:   01/24/22 0927  BP: 104/60  Pulse: 76  Temp: 98.3 F (36.8 C)  Weight: 192 lb 9.6 oz (87.4 kg)    Fetal Status: Fetal Heart Rate (bpm): 145 Fundal Height: 18 cm Movement: Absent     General:  Alert, oriented and cooperative. Patient is in no acute distress.  Skin: Skin is warm and dry. No rash noted.   Cardiovascular: Normal heart rate noted  Respiratory: Normal respiratory effort, no problems with respiration noted  Abdomen: Soft, gravid, appropriate for gestational age.  Pain/Pressure: Absent     Pelvic: Cervical exam deferred        Extremities: Normal range of motion.  Edema: None  Mental Status: Normal mood and affect. Normal behavior. Normal judgment and thought content.   Assessment and Plan:  Pregnancy: [id number] at [redacted]w[redacted]d  1. Anemia affecting pregnancy in first trimester -Hgb = 10.9.  Patient to take Iron as directed and  incorporate iron-rich foods.   - Hemoglobin, fingerstick  2. Supervision of high risk pregnancy, antepartum -36 year old female in clinic today for prenatal care.  No complaints.  -Patient states she taking PNV daily.  -Patient declined Quad screen today.   3. Obesity affecting pregnancy, antepartum -2 lb 9.6 oz (1.179 kg)  -Patient taking ASA daily. -Encouraged patient to incorporate foods low in sugar, fats, and carbs and drink at least 6-8 bottles of water. Patient also encouraged frequent exercise.  Due to a language barrier an interpreter Shirley Park) was used for the provider portion of the visit.      Term labor symptoms and general obstetric precautions including but not limited to vaginal bleeding, contractions, leaking of fluid and fetal movement were reviewed in detail with the patient. Please refer to After Visit Summary for other counseling recommendations.   Return in about 4 weeks (around 02/21/2022) for Routine prenatal care visit.  Future Appointments  Date Time Provider [place]  02/21/2022  9:00 AM [facility] PROVIDER [facility] None  03/06/2022  8:00 AM [facility]    Gregary Cromer, FNP

## 2022-02-17 ENCOUNTER — Telehealth: Payer: Self-pay

## 2022-02-17 NOTE — Telephone Encounter (Signed)
Shirley Park, [facility] Care Manager , notified Maternity Team that patient's U/S scheduled in July however patients insurance coverage expires the end of this month. Called  in [place] for a June U/S appointment. However no openings at this time. Delynn Flavin RN

## 2022-02-19 NOTE — Progress Notes (Signed)
TC to [facility] MFM [place] to find out if they have any U/S appointments before the end of June 2023 as patient is concerned that her temporary Medicaid will expire on 02/28/22. No appointments currently available, but per [facility] MFM, patient may call each day until the end of the month to inquire about cancellations/openings (she may request interpreter). Referral faxed to [facility] MFM [facility] and confirmation received.Burt Knack, RN

## 2022-02-21 ENCOUNTER — Ambulatory Visit: Payer: HRSA Program | Admitting: Advanced Practice Midwife

## 2022-02-21 VITALS — BP 108/73 | HR 80 | Temp 97.4°F | Wt 196.2 lb

## 2022-02-21 DIAGNOSIS — O0990 Supervision of high risk pregnancy, unspecified, unspecified trimester: Secondary | ICD-10-CM

## 2022-02-21 DIAGNOSIS — O99212 Obesity complicating pregnancy, second trimester: Secondary | ICD-10-CM | POA: Diagnosis not present

## 2022-02-21 DIAGNOSIS — O99210 Obesity complicating pregnancy, unspecified trimester: Secondary | ICD-10-CM

## 2022-02-21 DIAGNOSIS — O09529 Supervision of elderly multigravida, unspecified trimester: Secondary | ICD-10-CM

## 2022-02-21 DIAGNOSIS — O0992 Supervision of high risk pregnancy, unspecified, second trimester: Secondary | ICD-10-CM

## 2022-02-21 DIAGNOSIS — O99012 Anemia complicating pregnancy, second trimester: Secondary | ICD-10-CM | POA: Diagnosis not present

## 2022-02-21 DIAGNOSIS — O99810 Abnormal glucose complicating pregnancy: Secondary | ICD-10-CM

## 2022-02-21 DIAGNOSIS — O99011 Anemia complicating pregnancy, first trimester: Secondary | ICD-10-CM

## 2022-02-21 DIAGNOSIS — Z550 Illiteracy and low-level literacy: Secondary | ICD-10-CM

## 2022-02-21 LAB — HEMOGLOBIN, FINGERSTICK: Hemoglobin: 11.7 g/dL (ref 11.1–15.9)

## 2022-02-21 MED ORDER — IRON (FERROUS SULFATE) 325 (65 FE) MG PO TABS: 1.0000 | ORAL_TABLET | Freq: Three times a day (TID) | ORAL | 0 refills | Status: AC

## 2022-02-21 MED ORDER — IRON (FERROUS SULFATE) 325 (65 FE) MG PO TABS: 1.0000 | ORAL_TABLET | Freq: Every day | ORAL | 0 refills | Status: DC

## 2022-03-06 ENCOUNTER — Ambulatory Visit: Payer: Self-pay | Attending: Nurse Practitioner

## 2022-03-06 NOTE — Progress Notes (Deleted)
Pt was a "No Show" for her MFM appt @ [facility] today.

## 2022-03-21 ENCOUNTER — Ambulatory Visit: Payer: Self-pay | Admitting: Nurse Practitioner

## 2022-03-21 VITALS — BP 102/64 | HR 83 | Temp 97.6°F | Wt 201.6 lb

## 2022-03-21 DIAGNOSIS — O99012 Anemia complicating pregnancy, second trimester: Secondary | ICD-10-CM

## 2022-03-21 DIAGNOSIS — O99011 Anemia complicating pregnancy, first trimester: Secondary | ICD-10-CM

## 2022-03-21 DIAGNOSIS — O99210 Obesity complicating pregnancy, unspecified trimester: Secondary | ICD-10-CM

## 2022-03-21 DIAGNOSIS — O0990 Supervision of high risk pregnancy, unspecified, unspecified trimester: Secondary | ICD-10-CM

## 2022-03-21 DIAGNOSIS — O0992 Supervision of high risk pregnancy, unspecified, second trimester: Secondary | ICD-10-CM

## 2022-03-21 DIAGNOSIS — O99212 Obesity complicating pregnancy, second trimester: Secondary | ICD-10-CM

## 2022-03-21 NOTE — Progress Notes (Signed)
Patient is here for MH RV at 24w 1d.   Patient states she was a no-show for [facility] [place] due to financial reasons as her medicaid coverage ended in June but earliest [place] was scheduled for 03/06/22. Patient is very fearful of getting billed as she can not afford payments. Per patient she still receives bills from her 37 year old son from the hospital and it can be overwhelming.   Patient states she is willing to go to [facility] but only if she can pay the total $200 upfront as she does not want to get billed. She states she will be willing to go if appt is in August so she can save money.   PHQ9 and high risk forms read to patient and filled out.   PTL handout read to patient as well and questions answered.    Earlyne Iba, RN

## 2022-03-21 NOTE — Progress Notes (Signed)
[facility] U/S referral faxed with snapshot (no previous U/S). Confirmation received.Burt Knack, RN

## 2022-03-21 NOTE — Progress Notes (Signed)
[facility] Health Department Maternal Health Clinic  PRENATAL VISIT NOTE  Subjective:  Shirley Park is a 36 y.o. [phone] at [redacted]w[redacted]d being seen today for ongoing prenatal care.  She is currently monitored for the following issues for this high-risk pregnancy and has Cervical dysplasia; Illiterate: can't read or write or sign her name; Obesity affecting pregnancy BMI=35.5; Advanced maternal age in multigravida 36 yo; Missed abortion with fetal demise before 20 completed weeks of gestation; Anemia affecting pregnancy in first trimester; Supervision of high risk pregnancy, antepartum; and Abnormal glucose affecting pregnancy on their problem list.  Patient reports  no complaints .  Contractions: Not present. Vag. Bleeding: None.  Movement: Present. Denies leaking of fluid/ROM.   The following portions of the patient's history were reviewed and updated as appropriate: allergies, current medications, past family history, past medical history, past social history, past surgical history and problem list. Problem list updated.  Objective:   Vitals:   03/21/22 0914  BP: 102/64  Pulse: 83  Temp: 97.6 F (36.4 C)  Weight: 201 lb 9.6 oz (91.4 kg)    Fetal Status: Fetal Heart Rate (bpm): 145 Fundal Height: 28 cm Movement: Present     General:  Alert, oriented and cooperative. Patient is in no acute distress.  Skin: Skin is warm and dry. No rash noted.   Cardiovascular: Normal heart rate noted  Respiratory: Normal respiratory effort, no problems with respiration noted  Abdomen: Soft, gravid, appropriate for gestational age.  Pain/Pressure: Absent     Pelvic: Cervical exam deferred        Extremities: Normal range of motion.  Edema: None  Mental Status: Normal mood and affect. Normal behavior. Normal judgment and thought content.   Assessment and Plan:  Pregnancy: [id number] at [redacted]w[redacted]d  1. Supervision of high risk pregnancy, antepartum -36 year old female in clinic today for prenatal  care. -Patient with no complaints. -Patient reports taking PNV today.   -PHQ-9=0 -Signs and symptoms of PTL discussed with patient.  -Size greater than dating U/S scheduled for 03/06/22 patient no showed due to finances and  presumptive medicaid running out.  Patient to deliver with [facility].  Patient informed of paying out of pocket for an U/S with [facility].  Patient states she may be able to afford the U/S.  Will submit a referral for an U/S with [facility].    2. Anemia affecting pregnancy in first trimester -Patient states she continues to take Iron daily separate from PNV with juice.  Encouraged to continue to incorporate Iron rich foods.   3. Obesity affecting pregnancy, antepartum -11 lb 9.6 oz (5.262 kg)  -Patient states she continues to take ASA daily. -Encouraged to exercise daily or at least 3 times a week.  Also encouraged to limit foods high in fat, carbs, and sugar and drink at least 6-8 bottles of water daily.    Term labor symptoms and general obstetric precautions including but not limited to vaginal bleeding, contractions, leaking of fluid and fetal movement were reviewed in detail with the patient. Please refer to After Visit Summary for other counseling recommendations.   Due to a language barrier an interpreter Salli Real) was used for the provider portion of the visit.     Return in about 4 weeks (around 04/18/2022) for Routine prenatal care visit.  Future Appointments  Date Time Provider Department Center  04/18/2022 10:00 AM [facility] PROVIDER [facility] None    Glenna Fellows, FNP

## 2022-03-27 ENCOUNTER — Telehealth: Payer: Self-pay

## 2022-03-27 NOTE — Telephone Encounter (Signed)
Call to [facility] to ascertain if dating / anatomy US scheduled. Per Lawanna Kobus, [place] appt scheduled for 03/31/2022 at 3:45 pm. Jossie Ng, RN

## 2022-04-17 ENCOUNTER — Telehealth: Payer: Self-pay

## 2022-04-17 NOTE — Telephone Encounter (Signed)
TC to patient to inform her of need for repeat 3 hour gtt during her routine appointment tomorrow. Patient counseled to come in at 0800 to begin her glucose test. Counseled to fast from 12 midnight tonight until after her appointment tomorrow, like she did for her previous 3 hour gtt. Patient states she can come at 0800 tomorrow and states understanding of fasting protocol. Patient can start her 3 hour gtt at 0800 and be seen for her regular MH RV at 10:00 as scheduled. Patient aware she will be here for most of the morning. Patient denies questions at this time. [employer], [place] [phone].Marland KitchenBurt Knack, RN

## 2022-04-18 ENCOUNTER — Ambulatory Visit: Payer: Self-pay

## 2022-04-18 ENCOUNTER — Ambulatory Visit: Payer: Self-pay | Admitting: Advanced Practice Midwife

## 2022-04-18 VITALS — BP 113/78 | HR 80 | Temp 97.7°F | Wt 209.4 lb

## 2022-04-18 DIAGNOSIS — O09529 Supervision of elderly multigravida, unspecified trimester: Secondary | ICD-10-CM

## 2022-04-18 DIAGNOSIS — O99011 Anemia complicating pregnancy, first trimester: Secondary | ICD-10-CM

## 2022-04-18 DIAGNOSIS — Z550 Illiteracy and low-level literacy: Secondary | ICD-10-CM

## 2022-04-18 DIAGNOSIS — O99013 Anemia complicating pregnancy, third trimester: Secondary | ICD-10-CM

## 2022-04-18 DIAGNOSIS — O0990 Supervision of high risk pregnancy, unspecified, unspecified trimester: Secondary | ICD-10-CM

## 2022-04-18 DIAGNOSIS — O0993 Supervision of high risk pregnancy, unspecified, third trimester: Secondary | ICD-10-CM

## 2022-04-18 DIAGNOSIS — O99810 Abnormal glucose complicating pregnancy: Secondary | ICD-10-CM

## 2022-04-18 DIAGNOSIS — O99210 Obesity complicating pregnancy, unspecified trimester: Secondary | ICD-10-CM

## 2022-04-18 DIAGNOSIS — Z23 Encounter for immunization: Secondary | ICD-10-CM

## 2022-04-18 DIAGNOSIS — O09523 Supervision of elderly multigravida, third trimester: Secondary | ICD-10-CM

## 2022-04-18 DIAGNOSIS — O99213 Obesity complicating pregnancy, third trimester: Secondary | ICD-10-CM

## 2022-04-18 LAB — HEMOGLOBIN, FINGERSTICK: Hemoglobin: 11.5 g/dL (ref 11.1–15.9)

## 2022-04-18 NOTE — Progress Notes (Signed)
[facility] [place] referral faxed for S > D with snapshot pages and fax confirmation received. As prior US at [facility], appt needs to be scheduled directly with clinic which will open on Tuesday. Sticky note regarding above on [place] referral (on nurse to do cart). Jossie Ng, RN

## 2022-04-18 NOTE — Progress Notes (Signed)
[facility] Shirley Park, utilized to interpret today. Introduced to Lab for 3 Hr glucose test upon presentation to clinic. Patient states has not eaten/drank since 10 pm yesterday. TDAP VIS given. Hgb reviewed at visit. Shiela Mayer RN

## 2022-04-18 NOTE — Progress Notes (Signed)
[facility]  PRENATAL VISIT NOTE  Subjective:  Shirley Park is a 36 y.o. [phone] at [redacted]w[redacted]d being seen today for ongoing prenatal care.  She is currently monitored for the following issues for this high-risk pregnancy and has Cervical dysplasia; Illiterate: can't read or write or sign her name; Obesity affecting pregnancy BMI=35.5; Advanced maternal age in multigravida 36 yo; Missed abortion with fetal demise before 20 completed weeks of gestation; Anemia affecting pregnancy in first trimester; Supervision of high risk pregnancy, antepartum; and Abnormal glucose affecting pregnancy on their problem list.  Patient reports no complaints.  Contractions: Not present. Vag. Bleeding: None.  Movement: Present. Denies leaking of fluid/ROM.   The following portions of the patient's history were reviewed and updated as appropriate: allergies, current medications, past family history, past medical history, past social history, past surgical history and problem list. Problem list updated.  Objective:   Vitals:   04/18/22 0940  BP: 113/78  Pulse: 80  Temp: 97.7 F (36.5 C)  Weight: 209 lb 6.4 oz (95 kg)    Fetal Status: Fetal Heart Rate (bpm): 130 Fundal Height: 34 cm Movement: Present     General:  Alert, oriented and cooperative. Patient is in no acute distress.  Skin: Skin is warm and dry. No rash noted.   Cardiovascular: Normal heart rate noted  Respiratory: Normal respiratory effort, no problems with respiration noted  Abdomen: Soft, gravid, appropriate for gestational age.  Pain/Pressure: Absent     Pelvic: Cervical exam deferred        Extremities: Normal range of motion.  Edema: None  Mental Status: Normal mood and affect. Normal behavior. Normal judgment and thought content.   Assessment and Plan:  Pregnancy: [id number] at [redacted]w[redacted]d  1. Abnormal glucose affecting pregnancy Had 3 hour GTT on 5/88/23=wnl 3 hour GTT today - Glucose  Tolerance Test, 6 Hour  2. Supervision of high risk pregnancy, antepartum DNKA first u/s on 03/06/22 Reviewed 03/31/22 u/s at 26.0 wks with anterior placenta, AFI wnl, anatomy wnl S>D today; u/s ordered Pt doesn't know birth weights of her other children Not working 19 lb 6.4 oz (8.8 kg)  - Hemoglobin, venipuncture - HIV-1/HIV-2 Qualitative RNA - RPR - Tdap vaccine greater than or equal to 7yo IM  3. Anemia affecting pregnancy in first trimester Taking FeSo4 I daily with oj   4. Antepartum multigravida of advanced maternal age 31 yo Declines genetic testing and counseling  5. Obesity affecting pregnancy, antepartum 19 lb 6.4 oz (8.8 kg) 8 lb wt gain in past 4 wks Walking with daughter 2x/wk x 1 hour Taking ASA 81 mg daily  6. Illiterate: can't read or write or sign her name    Preterm labor symptoms and general obstetric precautions including but not limited to vaginal bleeding, contractions, leaking of fluid and fetal movement were reviewed in detail with the patient. Please refer to After Visit Summary for other counseling recommendations.  Return in about 2 weeks (around 05/02/2022) for routine PNC.  No future appointments.  Alberteen Spindle, CNM

## 2022-04-19 LAB — GLUCOSE TOLERANCE TEST, 6 HOUR
Glucose, 1 Hour GTT: 203 mg/dL — ABNORMAL HIGH (ref 70–199)
Glucose, 2 hour: 183 mg/dL — ABNORMAL HIGH (ref 70–139)
Glucose, 3 hour: 135 mg/dL — ABNORMAL HIGH (ref 70–109)
Glucose, GTT - Fasting: 101 mg/dL — ABNORMAL HIGH (ref 70–99)

## 2022-04-20 LAB — HIV-1/HIV-2 QUALITATIVE RNA
HIV-1 RNA, Qualitative: NONREACTIVE
HIV-2 RNA, Qualitative: NONREACTIVE

## 2022-04-20 LAB — RPR: RPR Ser Ql: NONREACTIVE

## 2022-04-21 ENCOUNTER — Encounter: Payer: Self-pay | Admitting: Advanced Practice Midwife

## 2022-04-21 ENCOUNTER — Telehealth: Payer: Self-pay | Admitting: Advanced Practice Midwife

## 2022-04-21 ENCOUNTER — Other Ambulatory Visit: Payer: Self-pay | Admitting: Advanced Practice Midwife

## 2022-04-21 DIAGNOSIS — O24419 Gestational diabetes mellitus in pregnancy, unspecified control: Secondary | ICD-10-CM

## 2022-04-21 HISTORY — DX: Gestational diabetes mellitus in pregnancy, unspecified control: O24.419

## 2022-04-21 MED ORDER — LANCETS MISC: 1.0000 [IU] | Freq: Four times a day (QID) | 12 refills | Status: DC

## 2022-04-21 MED ORDER — BLOOD GLUCOSE TEST VI STRP: 1.0000 [IU] | ORAL_STRIP | Freq: Four times a day (QID) | 12 refills | Status: DC

## 2022-04-21 MED ORDER — BLOOD GLUCOSE MONITOR KIT: PACK | 0 refills | Status: DC

## 2022-04-21 NOTE — Telephone Encounter (Signed)
T/C to pt via interpreter Marlene Yemen to discuss new dx of gestational diabetes, potential implications to self and fetus, expectations of buying diabetic supplies and attending 2 apts at [facility], weekly apts with [place] due to high risk pregnancy now. Pt counseled [facility] will call her with 2 apts to teach her about gestational diabetes and she should buy her supplies at her pharmacy and take them to [facility] apt so they can teach her how to check her blood sugars. Counseled will need weekly apts and will need to bring her blood sugar log each apt. Questions answered

## 2022-04-22 ENCOUNTER — Ambulatory Visit: Payer: Self-pay | Admitting: *Deleted

## 2022-04-23 ENCOUNTER — Telehealth: Payer: Self-pay

## 2022-04-23 NOTE — Progress Notes (Signed)
[facility] U/S referral faxed with snapshot. Confirmation received. Patient aware to take deposit of at least $50 with her to appointment, see phone note from 04/23/2022.Marland KitchenBurt Knack, RN

## 2022-04-23 NOTE — Telephone Encounter (Signed)
TC to patient regarding [facility] U/S in [place] on 05/27/2022. Per Provider, Hazle Coca, CNM, patient needs U/S sooner than that date if possible. Patient asked if she can get to [facility] if they have an appointment earlier in September. Patient states she can't get to [facility]. Patient states her daughter drives her to her maternity appointments, but not sure if her daughter can take her to [place]. Patient also states she really can't afford another U/S financially. Patient counseled on importance of this U/S as patient is measuring larger than expected in her pregnancy and that an U/S can help [place] adjust her care if needed to provide healthier outcomes for her and her baby. Patient states she won't go to an U/S in [place], unless she won't have to pay for it. Patient missed her 03/06/2022 U/S at [facility] in [place] and it appears no U/S has been done during this pregnancy. Patient states she had one at the hospital last month. RN to call patient again after looking into U/S options. Patient states agreement. [address], [place], [place] # [id number]. Referral for [facility] found, no U/S results found. TC to [facility] and requested records faxed to [facility]. Patient had anatomy U/S at [facility] on 03/31/2022. TC to client to ask if she would like to have her U/S at [facility] again and if she can take $50 with her to appointment. Patient states that she can take that money and wants to take the whole amount ($125). Patient states she would rather take the entire amount with her so that she won't be billed later. Patient states she won't have the money this week, but next week is ok. Patient counseled that this RN will ask the provider in clinic to complete an U/S referral for [facility] and that we would call her back with an appointment. Patient states understanding. Interpreter, M. Yemen.Burt Knack, RN

## 2022-05-02 ENCOUNTER — Encounter: Payer: Self-pay | Admitting: *Deleted

## 2022-05-02 ENCOUNTER — Encounter: Payer: Self-pay | Admitting: Advanced Practice Midwife

## 2022-05-02 ENCOUNTER — Ambulatory Visit: Payer: Self-pay | Admitting: Advanced Practice Midwife

## 2022-05-02 ENCOUNTER — Encounter: Payer: Self-pay | Attending: Advanced Practice Midwife | Admitting: *Deleted

## 2022-05-02 VITALS — BP 117/73 | HR 92 | Temp 97.5°F | Wt 205.0 lb

## 2022-05-02 VITALS — BP 104/76 | Ht 62.0 in | Wt 205.5 lb

## 2022-05-02 DIAGNOSIS — O09523 Supervision of elderly multigravida, third trimester: Secondary | ICD-10-CM

## 2022-05-02 DIAGNOSIS — O99213 Obesity complicating pregnancy, third trimester: Secondary | ICD-10-CM

## 2022-05-02 DIAGNOSIS — Z550 Illiteracy and low-level literacy: Secondary | ICD-10-CM

## 2022-05-02 DIAGNOSIS — O99810 Abnormal glucose complicating pregnancy: Secondary | ICD-10-CM

## 2022-05-02 DIAGNOSIS — F32A Depression, unspecified: Secondary | ICD-10-CM | POA: Insufficient documentation

## 2022-05-02 DIAGNOSIS — O09529 Supervision of elderly multigravida, unspecified trimester: Secondary | ICD-10-CM

## 2022-05-02 DIAGNOSIS — O0993 Supervision of high risk pregnancy, unspecified, third trimester: Secondary | ICD-10-CM

## 2022-05-02 DIAGNOSIS — O99013 Anemia complicating pregnancy, third trimester: Secondary | ICD-10-CM

## 2022-05-02 DIAGNOSIS — O99011 Anemia complicating pregnancy, first trimester: Secondary | ICD-10-CM

## 2022-05-02 DIAGNOSIS — O99340 Other mental disorders complicating pregnancy, unspecified trimester: Secondary | ICD-10-CM

## 2022-05-02 DIAGNOSIS — O24410 Gestational diabetes mellitus in pregnancy, diet controlled: Secondary | ICD-10-CM | POA: Insufficient documentation

## 2022-05-02 DIAGNOSIS — O99210 Obesity complicating pregnancy, unspecified trimester: Secondary | ICD-10-CM

## 2022-05-02 DIAGNOSIS — O24419 Gestational diabetes mellitus in pregnancy, unspecified control: Secondary | ICD-10-CM

## 2022-05-02 DIAGNOSIS — O0990 Supervision of high risk pregnancy, unspecified, unspecified trimester: Secondary | ICD-10-CM

## 2022-05-02 DIAGNOSIS — Z3A34 34 weeks gestation of pregnancy: Secondary | ICD-10-CM | POA: Insufficient documentation

## 2022-05-02 DIAGNOSIS — O99343 Other mental disorders complicating pregnancy, third trimester: Secondary | ICD-10-CM

## 2022-05-02 DIAGNOSIS — E669 Obesity, unspecified: Secondary | ICD-10-CM | POA: Insufficient documentation

## 2022-05-02 HISTORY — DX: Other mental disorders complicating pregnancy, unspecified trimester: O99.340

## 2022-05-02 LAB — URINALYSIS
Bilirubin, UA: NEGATIVE
Glucose, UA: NEGATIVE
Ketones, UA: NEGATIVE
Leukocytes,UA: NEGATIVE
Nitrite, UA: NEGATIVE
Protein,UA: NEGATIVE
RBC, UA: NEGATIVE
Specific Gravity, UA: 1.02 (ref 1.005–1.030)
Urobilinogen, Ur: 0.2 mg/dL (ref 0.2–1.0)
pH, UA: 5.5 (ref 5.0–7.5)

## 2022-05-02 NOTE — Progress Notes (Signed)
Diabetes Self-Management Education  Visit Type: First/Initial  Appt. Start Time: 1025 Appt. End Time: 1200  05/02/2022  [name], identified by name and date of birth, is a 36 y.o. female with a diagnosis of Diabetes: Gestational Diabetes.   ASSESSMENT  Blood pressure 104/76, height 5\' 2"  (1.575 m), weight 205 lb 8 oz (93.2 kg), last menstrual period 10/03/2021, estimated date of delivery 07/07/2022 Body mass index is 37.59 kg/m.   Diabetes Self-Management Education - 05/02/22 1222       Visit Information   Visit Type First/Initial      Initial Visit   Diabetes Type Gestational Diabetes    Date Diagnosed 2 weeks ago    Are you currently following a meal plan? Yes    What type of meal plan do you follow? "more vegetables and less sweets"    Are you taking your medications as prescribed? Yes      Health Coping   How would you rate your overall health? Good      Psychosocial Assessment   Patient Belief/Attitude about Diabetes Other (comment)   "sad"   What is the hardest part about your diabetes right now, causing you the most concern, or is the most worrisome to you about your diabetes?   Making healty food and beverage choices;Checking blood sugar    Self-care barriers English as a second language;Low literacy    Self-management support Doctor's office;Friends;Family    Other persons present Interpreter;Friend   Intrepreter Lucy   Patient Concerns Nutrition/Meal planning;Glycemic Control;Monitoring    Special Needs Verbal instruction   Illiterate - 1st grade level in her country   Preferred Learning Style Auditory;Other (comment)   talking/discussion   Learning Readiness Ready    How often do you need to have someone help you when you read instructions, pamphlets, or other written materials from your doctor or pharmacy? 5 - Always    What is the last grade level you completed in school? 1st in her country      Pre-Education Assessment   Patient understands  the diabetes disease and treatment process. Needs Instruction    Patient understands incorporating nutritional management into lifestyle. Needs Instruction    Patient undertands incorporating physical activity into lifestyle. Needs Instruction    Patient understands using medications safely. Needs Instruction    Patient understands monitoring blood glucose, interpreting and using results Needs Instruction    Patient understands prevention, detection, and treatment of acute complications. Needs Instruction    Patient understands prevention, detection, and treatment of chronic complications. Needs Instruction    Patient understands how to develop strategies to address psychosocial issues. Needs Instruction    Patient understands how to develop strategies to promote health/change behavior. Needs Instruction      Complications   Last HgB A1C per patient/outside source 5.5 %   12/30/2021   How often do you check your blood sugar? 0 times/day (not testing)   Pt brought her meter from home ([employer]) and was instructed on use. BG upon return demonstration was 78 mg/dL at 03/01/2022 am - fasting.   Have you had a dilated eye exam in the past 12 months? No    Have you had a dental exam in the past 12 months? No    Are you checking your feet? No      Dietary Intake   Breakfast tortilla and cheese; bean soup; eggs    Snack (morning) reports 2 snacks/day - usually fruit (apple, grapes, banana, pineapple)  Lunch soup with rice or noodles, small potato, rice, salad (lettuce, tomatoes, cuccumbers, cheese)    Dinner chicken, fish; spinach, beans, tortilla, cheese, salads, corn, carrots, squash    Beverage(s) water, juice, milk, coffee      Activity / Exercise   Activity / Exercise Type Light (walking / raking leaves)    How many days per week do you exercise? 3    How many minutes per day do you exercise? 45    Total minutes per week of exercise 135      Patient Education   Previous Diabetes  Education No    Disease Pathophysiology Definition of diabetes, type 1 and 2, and the diagnosis of diabetes;Factors that contribute to the development of diabetes    Healthy Eating Role of diet in the treatment of diabetes and the relationship between the three main macronutrients and blood glucose level;Food label reading, portion sizes and measuring food.;Reviewed blood glucose goals for pre and post meals and how to evaluate the patients' food intake on their blood glucose level. Provided [employer] for [employer]   Being Active Role of exercise on diabetes management, blood pressure control and cardiac health.    Medications Other (comment)   Limited use of oral medications during pregnancy and potential for insulin   Monitoring Taught/evaluated SMBG meter.;Purpose and frequency of SMBG.;Taught/discussed recording of test results and interpretation of SMBG.;Identified appropriate SMBG and/or A1C goals.    Chronic complications Relationship between chronic complications and blood glucose control    Diabetes Stress and Support Identified and addressed patients feelings and concerns about diabetes    Preconception care Pregnancy and GDM  Role of pre-pregnancy blood glucose control on the development of the fetus;Reviewed with patient blood glucose goals with pregnancy;Role of family planning for patients with diabetes      Individualized Goals (developed by patient)   Reducing Risk Other (comment)   improve blood sugars, prevent diabetes complications     Outcomes   Expected Outcomes Demonstrated interest in learning. Expect positive outcomes    Program Status Not Completed         Individualized Plan for Diabetes Self-Management Training:   Learning Objective:  Patient will have a greater understanding of diabetes self-management. Patient education plan is to attend individual and/or group sessions per assessed needs and concerns.   Plan:   Patient Instructions   Read booklet on Gestational Diabetes Follow Gestational Meal Planning Guidelines Avoid sugar sweetened drinks (juice) Drink 6-8 glasses of water per day Include 1 serving of protein when eating fruit for a snack Check blood sugars 4 x day - before breakfast and 2 hrs after every meal and record  Bring blood sugar log to all MD appointments Purchase urine ketone strips if instructed by MD and check urine ketones every am:  If + increase bedtime snack to 1 protein and 2 carbohydrate servings Walk 20-30 minutes at least 5 x week if permitted by MD  Expected Outcomes:  Demonstrated interest in learning. Expect positive outcomes  Education material provided:  Gestational Booklet (Spanish) Gestational Meal Planning Guidelines (Spanish) Simple Meal Plan (Spanish) Goals for a Healthy Pregnancy (Spanish)   If problems or questions, patient to contact team via:   Sharion Settler, RN, CCM, CDCES [phone]  Future DSME appointment:   PRN - the patient doesn't have insurance and reports she can't afford the next appointment with the dietitian

## 2022-05-02 NOTE — Progress Notes (Signed)
Patient here for MH RV at [redacted]w[redacted]d. Patient is aware she has appointment today with [facility] at 10am - she has her diabetic supplies with her that were e-prescribed.   Patient aware of [facility] f/u ultrasound on 05/06/22 for s>d on 05/06/22 at 3pm.   Patient has been made aware of [place] MFM OB detail with [facility] and the importance to keep appointment. She was given today [place] financial assistance application to fill out and take to this appointment.   Urine dip reivewed - no treatment indicated.   Earlyne Iba, RN

## 2022-05-02 NOTE — Progress Notes (Signed)
[facility]  PRENATAL VISIT NOTE  Subjective:  Shirley Park is a 36 y.o. [phone] at [redacted]w[redacted]d being seen today for ongoing prenatal care.  She is currently monitored for the following issues for this high-risk pregnancy and has Cervical dysplasia; Illiterate: can't read or write or sign her name; Obesity affecting pregnancy BMI=35.5; Advanced maternal age in multigravida 36 yo; Missed abortion with fetal demise before 20 completed weeks of gestation; Anemia affecting pregnancy in first trimester; Supervision of high risk pregnancy, antepartum; Abnormal glucose affecting pregnancy; and Gestational diabetes 04/18/22 on their problem list.  Patient reports no complaints.  Contractions: Not present. Vag. Bleeding: None.  Movement: Present. Denies leaking of fluid/ROM.   The following portions of the patient's history were reviewed and updated as appropriate: allergies, current medications, past family history, past medical history, past social history, past surgical history and problem list. Problem list updated.  Objective:   Vitals:   05/02/22 0837  BP: 117/73  Pulse: 92  Temp: (!) 97.5 F (36.4 C)  Weight: 205 lb (93 kg)    Fetal Status: Fetal Heart Rate (bpm): 135 Fundal Height: 34 cm Movement: Present     General:  Alert, oriented and cooperative. Patient is in no acute distress.  Skin: Skin is warm and dry. No rash noted.   Cardiovascular: Normal heart rate noted  Respiratory: Normal respiratory effort, no problems with respiration noted  Abdomen: Soft, gravid, appropriate for gestational age.  Pain/Pressure: Absent     Pelvic: Cervical exam deferred        Extremities: Normal range of motion.  Edema: None  Mental Status: Normal mood and affect. Normal behavior. Normal judgment and thought content.   Assessment and Plan:  Pregnancy: [id number] at [redacted]w[redacted]d  1. Abnormal glucose affecting pregnancy Gestational diabetic  dx'd 04/18/22 -  Urinalysis (Urine Dip)  2. Gestational diabetes mellitus (GDM), antepartum, gestational diabetes dx'd 04/18/22 [facility] apt 04/22/22 [facility] apt today at 10:00 Bought glucometer and supplies and brought with her today to take to [facility] apt Given blood sugar log today Very limited understanding of diabetes and high risk status and potential implications to self and fetus--reviewed briefly (had discussed on phone call on 04/21/22) Unable to do complete diabetes assessment/exam today due to pt needing to be at [facility] for first [facility] apt in 30 min  Doesn't know how to check blood sugars yet Breakfast: nothing Dinner last night: vegetables (cauliflower, brocolli, carrots), water Lunch yesterday: vegetables, water U/a neg glucose, neg ketones, neg protein  3. Illiterate: can't read or write or sign her name   4. Anemia affecting pregnancy in first trimester Taking FeSo4 I daily with oj  5. Supervision of high risk pregnancy, antepartum Not working Distressed because her father fell and hit his head and died bleeding into brain in [place] 05-20-2022 and died Accepts counseling with Kathreen Cosier, LCSW [facility] 03/06/22 u/s [facility] 04/24/22 u/s for S>D due to financial concerns First u/s 03/31/22 at 26 wks, anterior placenta, AFI wnl, anatomy wnl U/s 05/06/22 for S>D MFM u/s and consult 05/27/22  6. Obesity affecting pregnancy, antepartum Taking ASA 81 mg daily 15 lb (6.804 kg) Lost 4 lb in last 2 wks  7. Antepartum multigravida of advanced maternal age 28 yo Declines genetic counseling/screening   Preterm labor symptoms and general obstetric precautions including but not limited to vaginal bleeding, contractions, leaking of fluid and fetal movement were reviewed in detail with the patient. Please refer to After Visit  Summary for other counseling recommendations.  Return in about 1 week (around 05/09/2022) for routine PNC.  Future Appointments  Date Time Provider  Department Center  05/02/2022 10:00 AM Shotwell, Orlean Bradford, RN [facility] None  05/27/2022  1:00 PM [facility] US1 [facility]  05/27/2022  2:00 PM [facility] CONSULT RM [facility] None    Alberteen Spindle, CNM

## 2022-05-02 NOTE — Patient Instructions (Signed)
Read booklet on Gestational Diabetes Follow Gestational Meal Planning Guidelines Avoid sugar sweetened drinks (juice) Drink 6-8 glasses of water per day Include 1 serving of protein when eating fruit for a snack Check blood sugars 4 x day - before breakfast and 2 hrs after every meal and record  Bring blood sugar log to all MD appointments Purchase urine ketone strips if instructed by MD and check urine ketones every am:  If + increase bedtime snack to 1 protein and 2 carbohydrate servings Walk 20-30 minutes at least 5 x week if permitted by MD

## 2022-05-07 ENCOUNTER — Other Ambulatory Visit: Payer: Self-pay

## 2022-05-09 ENCOUNTER — Encounter: Payer: Self-pay | Admitting: Nurse Practitioner

## 2022-05-09 ENCOUNTER — Ambulatory Visit: Payer: Self-pay | Admitting: Nurse Practitioner

## 2022-05-09 VITALS — BP 96/71 | HR 86 | Temp 97.4°F | Wt 207.6 lb

## 2022-05-09 DIAGNOSIS — O99011 Anemia complicating pregnancy, first trimester: Secondary | ICD-10-CM

## 2022-05-09 DIAGNOSIS — O99013 Anemia complicating pregnancy, third trimester: Secondary | ICD-10-CM

## 2022-05-09 DIAGNOSIS — O99340 Other mental disorders complicating pregnancy, unspecified trimester: Secondary | ICD-10-CM

## 2022-05-09 DIAGNOSIS — O0993 Supervision of high risk pregnancy, unspecified, third trimester: Secondary | ICD-10-CM

## 2022-05-09 DIAGNOSIS — F32A Depression, unspecified: Secondary | ICD-10-CM

## 2022-05-09 DIAGNOSIS — O24410 Gestational diabetes mellitus in pregnancy, diet controlled: Secondary | ICD-10-CM

## 2022-05-09 DIAGNOSIS — O99213 Obesity complicating pregnancy, third trimester: Secondary | ICD-10-CM

## 2022-05-09 DIAGNOSIS — O99210 Obesity complicating pregnancy, unspecified trimester: Secondary | ICD-10-CM

## 2022-05-09 DIAGNOSIS — O99343 Other mental disorders complicating pregnancy, third trimester: Secondary | ICD-10-CM

## 2022-05-09 DIAGNOSIS — O0990 Supervision of high risk pregnancy, unspecified, unspecified trimester: Secondary | ICD-10-CM

## 2022-05-09 LAB — URINALYSIS
Bilirubin, UA: NEGATIVE
Glucose, UA: NEGATIVE
Ketones, UA: NEGATIVE
Leukocytes,UA: NEGATIVE
Nitrite, UA: NEGATIVE
Protein,UA: NEGATIVE
RBC, UA: NEGATIVE
Specific Gravity, UA: 1.01 (ref 1.005–1.030)
Urobilinogen, Ur: 0.2 mg/dL (ref 0.2–1.0)
pH, UA: 7 (ref 5.0–7.5)

## 2022-05-09 NOTE — Progress Notes (Signed)
Urine dip today. Blood sugar log brought by client copied and in outguide for provider review. [facility] for size greater than dates 05/06/2022 at [facility]. Aware of 05/27/2022 [facility] [place] and consult appt at 1 pm (arrive 1245). Per client, she is aware of facility location as has had [place] there before. Building name / office number / address appt date and time written down and given to client. [facility] campus map given to client with [facility] highlighted. Correctly verbalizes how to take iron tablet and PNV. Taking iron tablet with orange juice. Jossie Ng, RN

## 2022-05-09 NOTE — Progress Notes (Signed)
[facility]  PRENATAL VISIT NOTE  Subjective:  Shirley Park is a 36 y.o. [phone] at [redacted]w[redacted]d being seen today for ongoing prenatal care.  She is currently monitored for the following issues for this high-risk pregnancy and has Cervical dysplasia; Illiterate: can't read or write or sign her name; Obesity affecting pregnancy BMI=35.5; Advanced maternal age in multigravida 36 yo; Missed abortion with fetal demise before 20 completed weeks of gestation; Anemia affecting pregnancy in first trimester; Supervision of high risk pregnancy, antepartum; Abnormal glucose affecting pregnancy; Gestational diabetes 04/18/22; and Depression affecting pregnancy on their problem list.  Patient reports no complaints.  Contractions: Not present. Vag. Bleeding: None.  Movement: Present. Denies leaking of fluid/ROM.   The following portions of the patient's history were reviewed and updated as appropriate: allergies, current medications, past family history, past medical history, past social history, past surgical history and problem list. Problem list updated.  Objective:   Vitals:   05/09/22 0928  BP: 96/71  Pulse: 86  Temp: (!) 97.4 F (36.3 C)  Weight: 207 lb 9.6 oz (94.2 kg)    Fetal Status: Fetal Heart Rate (bpm): 130 Fundal Height: 36 cm Movement: Present     General:  Alert, oriented and cooperative. Patient is in no acute distress.  Skin: Skin is warm and dry. No rash noted.   Cardiovascular: Normal heart rate noted  Respiratory: Normal respiratory effort, no problems with respiration noted  Abdomen: Soft, gravid, appropriate for gestational age.  Pain/Pressure: Absent     Pelvic: Cervical exam deferred        Extremities: Normal range of motion.  Edema: None  Mental Status: Normal mood and affect. Normal behavior. Normal judgment and thought content.   Assessment and Plan:  Pregnancy: [id number] at [redacted]w[redacted]d  1. Diet controlled gestational diabetes  mellitus (GDM) in third trimester -Detailed discussion surrounding diet, exercise, and blood sugar log.  Advised patient to limit sugar and carbs.  Encouraged patient to continue drink at least 6-8 bottles water.  Reviewed with patient of when to check blood sugars and where to document on log sheet. Will bring patient back in one week to assess blood sugars and log sheet.  -Blood sugar log values below. Encouraged continued daily exercise and diet modifications. 2 of  8 FBS values are abnormal (range was 78 to 107) 3 of 23 2hr pp values are abnormal (range was 77 to 148) Exercise: walks  60. minutes per day Diet recall :   Breakfast = 05/09/22- a piece of toast and water                       Lunch = 05/08/22- soup and water                       Dinner = 05/08/22- 1 tortilla with cheese, bean soup, and water                  Water: drinks about  8 bottles of water/day and night   - Urinalysis (Urine Dip)= negative   2. Obesity affecting pregnancy, antepartum -Advised patient to limit sugar, carbs, and fats.  Encouraged patient to continue drink at least 6-8 bottles water.   -Patient states she walks at least 1 hour a day.  -17 lb 9.6 oz (7.983 kg)  -Continue to take ASA daily  3. Anemia affecting pregnancy in first trimester -Patient reports taking Iron daily separate from  PNV with juice. Last Hgb 11.5 on 04/18/22.  4. Supervision of high risk pregnancy, antepartum -36 year old female in clinic today for prenatal care. -Patient reports taking PNV daily. -ROS reviewed, no complaints.  -Kick counts reviewed with patient.  -Size greater than dating.  Had an U/S appointment on 05/06/22 with [facility], patient did not keep appointment.  Patient has an appointment for another U/S on 05/27/22, strongly encouraged to keep appointment.    5. Depression affecting pregnancy -Denies signs of depression.  Will continue to monitor.   Due to a language barrier an interpreter Salli Real) was used for the  provider portion of the visit.      Term labor symptoms and general obstetric precautions including but not limited to vaginal bleeding, contractions, leaking of fluid and fetal movement were reviewed in detail with the patient. Please refer to After Visit Summary for other counseling recommendations.   Return in about 1 week (around 05/16/2022) for Routine prenatal care visit.  Future Appointments  Date Time Provider Department Center  05/21/2022  8:40 AM [facility] PROVIDER [facility] None  05/27/2022  1:00 PM [facility]  05/27/2022  2:00 PM [facility] CONSULT RM [facility] None    Glenna Fellows, FNP

## 2022-05-20 ENCOUNTER — Other Ambulatory Visit: Payer: Self-pay

## 2022-05-20 DIAGNOSIS — Z550 Illiteracy and low-level literacy: Secondary | ICD-10-CM

## 2022-05-20 DIAGNOSIS — O24410 Gestational diabetes mellitus in pregnancy, diet controlled: Secondary | ICD-10-CM

## 2022-05-21 ENCOUNTER — Ambulatory Visit: Payer: Self-pay | Admitting: Nurse Practitioner

## 2022-05-21 VITALS — BP 101/74 | HR 90 | Temp 97.0°F | Wt 210.6 lb

## 2022-05-21 DIAGNOSIS — O0990 Supervision of high risk pregnancy, unspecified, unspecified trimester: Secondary | ICD-10-CM

## 2022-05-21 DIAGNOSIS — O99343 Other mental disorders complicating pregnancy, third trimester: Secondary | ICD-10-CM

## 2022-05-21 DIAGNOSIS — O99340 Other mental disorders complicating pregnancy, unspecified trimester: Secondary | ICD-10-CM

## 2022-05-21 DIAGNOSIS — O99011 Anemia complicating pregnancy, first trimester: Secondary | ICD-10-CM

## 2022-05-21 DIAGNOSIS — O99210 Obesity complicating pregnancy, unspecified trimester: Secondary | ICD-10-CM

## 2022-05-21 DIAGNOSIS — O99213 Obesity complicating pregnancy, third trimester: Secondary | ICD-10-CM

## 2022-05-21 DIAGNOSIS — F32A Depression, unspecified: Secondary | ICD-10-CM

## 2022-05-21 DIAGNOSIS — O0993 Supervision of high risk pregnancy, unspecified, third trimester: Secondary | ICD-10-CM

## 2022-05-21 DIAGNOSIS — O24410 Gestational diabetes mellitus in pregnancy, diet controlled: Secondary | ICD-10-CM

## 2022-05-21 DIAGNOSIS — O99013 Anemia complicating pregnancy, third trimester: Secondary | ICD-10-CM

## 2022-05-21 LAB — URINALYSIS
Bilirubin, UA: NEGATIVE
Ketones, UA: NEGATIVE
Leukocytes,UA: NEGATIVE
Nitrite, UA: NEGATIVE
Protein,UA: NEGATIVE
RBC, UA: NEGATIVE
Specific Gravity, UA: 1.01 (ref 1.005–1.030)
Urobilinogen, Ur: 0.2 mg/dL (ref 0.2–1.0)
pH, UA: 6.5 (ref 5.0–7.5)

## 2022-05-21 NOTE — Progress Notes (Signed)
[place] [facility]  PRENATAL VISIT NOTE  Subjective:  Shirley Park is a 36 y.o. [phone] at [redacted]w[redacted]d being seen today for ongoing prenatal care.  She is currently monitored for the following issues for this high-risk pregnancy and has Cervical dysplasia; Illiterate: can't read or write or sign her name; Obesity affecting pregnancy BMI=35.5; Advanced maternal age in multigravida 36 yo; Missed abortion with fetal demise before 69 completed weeks of gestation; Anemia affecting pregnancy in first trimester; Supervision of high risk pregnancy, antepartum; Abnormal glucose affecting pregnancy; Gestational diabetes 04/18/22; and Depression affecting pregnancy on their problem list.  Patient reports no complaints.  Contractions: Not present. Vag. Bleeding: None.  Movement: Present. Denies leaking of fluid/ROM.   The following portions of the patient's history were reviewed and updated as appropriate: allergies, current medications, past family history, past medical history, past social history, past surgical history and problem list. Problem list updated.  Objective:   Vitals:   05/21/22 0832  BP: 101/74  Pulse: 90  Temp: (!) 97 F (36.1 C)  Weight: 210 lb 9.6 oz (95.5 kg)    Fetal Status: Fetal Heart Rate (bpm): 150 Fundal Height: 36 cm Movement: Present     General:  Alert, oriented and cooperative. Patient is in no acute distress.  Skin: Skin is warm and dry. No rash noted.   Cardiovascular: Normal heart rate noted  Respiratory: Normal respiratory effort, no problems with respiration noted  Abdomen: Soft, gravid, appropriate for gestational age.  Pain/Pressure: Absent     Pelvic: Cervical exam deferred        Extremities: Normal range of motion.  Edema: None  Mental Status: Normal mood and affect. Normal behavior. Normal judgment and thought content.   Assessment and Plan:  Pregnancy: [id number] at [redacted]w[redacted]d  1. Diet controlled gestational diabetes  mellitus (GDM) in third trimester -Advised to increase protein and limit carbs.  Patient admits to eating a lot of tortillas.  -Blood sugar log values below. Encouraged continued daily exercise and diet modifications. 6 of  13 FBS values are abnormal (range was 78 to 111) 3 of 24 2hr pp values are abnormal (range was 71 to 131) Exercise: walks  30. minutes per day Diet recall :   Breakfast = 4/58/09, cheese and a slice of bread                       Lunch = 05/20/22 soup                      Dinner = 05/20/22 vegetables and milk                      Snack =  Water: drinks about  6-8 bottles of water/day and night   - Urinalysis (Urine Dip)= trace of glucose   2. Obesity affecting pregnancy, antepartum -Advised patient to limit sugars, carbs, and fats.  Encouraged patient to continue to drink at least 6-8 bottles of water daily and increase physical activity. -20 lb 9.6 oz (9.344 kg)  -Patient continues to take ASA 81 mg daily.   3. Anemia affecting pregnancy in first trimester -Patient continues to take Iron daily separate from PNV with juice. Last Hgb 11.5 on 04/18/22.    4. Supervision of high risk pregnancy, antepartum -36 year old female in clinic today for prenatal care.   -Patient states she is taking PNV daily. -ROS reviewed, no complaints  -Paitent  reports taking PNV daily.  -U/S scheduled for 05/27/22 at 1 pm to assess size being greater than dating.  Patient strongly encouraged to keep appointment.    5. Depression affecting pregnancy -Denies signs of depression, will continue to monitor.    Term labor symptoms and general obstetric precautions including but not limited to vaginal bleeding, contractions, leaking of fluid and fetal movement were reviewed in detail with the patient. Please refer to After Visit Summary for other counseling recommendations.   Return in about 1 week (around 05/28/2022) for Routine prenatal care visit.  Future Appointments  Date Time Provider  [place]  05/26/2022  8:40 AM [facility] PROVIDER [facility] None  05/27/2022  1:00 PM [facility]  05/27/2022  2:00 PM [facility] CONSULT RM [facility] None    Gregary Cromer, FNP

## 2022-05-22 NOTE — Progress Notes (Signed)
Urine dip reviewed with provider during clinic visit. No treatment indicated.   Al Decant, RN

## 2022-05-26 ENCOUNTER — Ambulatory Visit: Payer: Self-pay | Admitting: Nurse Practitioner

## 2022-05-26 ENCOUNTER — Encounter: Payer: Self-pay | Admitting: Nurse Practitioner

## 2022-05-26 VITALS — BP 106/77 | HR 88 | Temp 97.2°F | Wt 208.4 lb

## 2022-05-26 DIAGNOSIS — O99213 Obesity complicating pregnancy, third trimester: Secondary | ICD-10-CM

## 2022-05-26 DIAGNOSIS — O24410 Gestational diabetes mellitus in pregnancy, diet controlled: Secondary | ICD-10-CM

## 2022-05-26 DIAGNOSIS — O99343 Other mental disorders complicating pregnancy, third trimester: Secondary | ICD-10-CM

## 2022-05-26 DIAGNOSIS — O99340 Other mental disorders complicating pregnancy, unspecified trimester: Secondary | ICD-10-CM

## 2022-05-26 DIAGNOSIS — O99210 Obesity complicating pregnancy, unspecified trimester: Secondary | ICD-10-CM

## 2022-05-26 DIAGNOSIS — O99013 Anemia complicating pregnancy, third trimester: Secondary | ICD-10-CM

## 2022-05-26 DIAGNOSIS — O0993 Supervision of high risk pregnancy, unspecified, third trimester: Secondary | ICD-10-CM

## 2022-05-26 DIAGNOSIS — O0990 Supervision of high risk pregnancy, unspecified, unspecified trimester: Secondary | ICD-10-CM

## 2022-05-26 DIAGNOSIS — F32A Depression, unspecified: Secondary | ICD-10-CM

## 2022-05-26 DIAGNOSIS — O99011 Anemia complicating pregnancy, first trimester: Secondary | ICD-10-CM

## 2022-05-26 LAB — URINALYSIS
Bilirubin, UA: NEGATIVE
Glucose, UA: NEGATIVE
Ketones, UA: NEGATIVE
Leukocytes,UA: NEGATIVE
Nitrite, UA: NEGATIVE
Protein,UA: NEGATIVE
RBC, UA: NEGATIVE
Specific Gravity, UA: 1.01 (ref 1.005–1.030)
Urobilinogen, Ur: 0.2 mg/dL (ref 0.2–1.0)
pH, UA: 6 (ref 5.0–7.5)

## 2022-05-26 NOTE — Progress Notes (Unsigned)
Aware of [facility] [place] appt tomorrow at 1300 with MFM consult to follow at 1400. Blood sugar log copied and in outguide for provider review. Urine dip today. Correctly verbalizes how to take iron and prenatal vitamin. Taking iron tablet with juice. Flu vaccine declined. Rich Number, RN Urine dip negative and reviewed by Gregary Cromer FNP-BC. Rich Number, RN

## 2022-05-26 NOTE — Progress Notes (Addendum)
[facility]  PRENATAL VISIT NOTE  Subjective:  Shirley Park is a 36 y.o. [phone] at [redacted]w[redacted]d being seen today for ongoing prenatal care.  She is currently monitored for the following issues for this high-risk pregnancy and has Cervical dysplasia; Illiterate: can't read or write or sign her name; Obesity affecting pregnancy BMI=35.5; Advanced maternal age in multigravida 36 yo; Missed abortion with fetal demise before 20 completed weeks of gestation; Anemia affecting pregnancy in first trimester; Supervision of high risk pregnancy, antepartum; Abnormal glucose affecting pregnancy; Gestational diabetes 04/18/22; and Depression affecting pregnancy on their problem list.  Patient reports no complaints.  Contractions: Not present. Vag. Bleeding: None.  Movement: Present. Denies leaking of fluid/ROM.   The following portions of the patient's history were reviewed and updated as appropriate: allergies, current medications, past family history, past medical history, past social history, past surgical history and problem list. Problem list updated.  Objective:   Vitals:   05/26/22 0906  BP: 106/77  Pulse: 88  Temp: (!) 97.2 F (36.2 C)  Weight: 208 lb 6.4 oz (94.5 kg)    Fetal Status: Fetal Heart Rate (bpm): 145 Fundal Height: 37 cm Movement: Present     General:  Alert, oriented and cooperative. Patient is in no acute distress.  Skin: Skin is warm and dry. No rash noted.   Cardiovascular: Normal heart rate noted  Respiratory: Normal respiratory effort, no problems with respiration noted  Abdomen: Soft, gravid, appropriate for gestational age.  Pain/Pressure: Absent     Pelvic: Cervical exam deferred        Extremities: Normal range of motion.  Edema: None  Mental Status: Normal mood and affect. Normal behavior. Normal judgment and thought content.   Assessment and Plan:  Pregnancy: [id number] at [redacted]w[redacted]d  1. Diet controlled gestational diabetes  mellitus (GDM) in third trimester -Blood sugar log values below. Since the start of appropriately recording blood sugars, which was 05/09/22.  Patient's fasting blood sugars have improved.  50% abnormal fasting blood sugar values noted. Notified patient at this time will not place on medication.  If fasting values continue to greater than or equal to 50%, will have a further discussion about diabetes being managed through medication.  Patient will continue to present blood sugar logs weekly.  Postprandial blood sugars are within normal limits.  Reviewed with patient the importance of increasing protein, eating more vegetables, healthy carbs, and incorporating a healthy snack 2 hours after dinner.  Encouraged continued daily exercise and diet modifications. 3 of  6 FBS values are abnormal (range was 90 to 100) 0 of 15 2hr pp values are abnormal (range was 81 to 120) Exercise: walks  60. minutes per day Diet recall :   Breakfast =  05/26/22, sausage sandwiches provided by [facility]                       Lunch = 05/25/22. Sausage and egg sandwiches                      Dinner = 05/25/22, chicken and a glass of milk                        Water: drinks about  6-8 bottles of water/day and night   - Urinalysis (Urine Dip)= negative   2. Supervision of high risk pregnancy, antepartum -36 year old female in clinic today for prenatal care.   -  Patient states she is taking PNV daily. -ROS reviewed, no complaints  -Paitent reports taking PNV daily.  -U/S scheduled for 05/27/22 at 1 pm to assess size being greater than dating.  Patient strongly encouraged to keep appointment.     3. Obesity affecting pregnancy, antepartum -Advised patient to limit sugars, carbs, and fats.  Encouraged patient to continue to drink at least 6-8 bottles of water daily and increase physical activity. -Patient continues to take ASA 81 mg daily.  -18 lb 6.4 oz (8.346 kg)   4. Anemia affecting pregnancy in first trimester -Patient  continues to take Iron daily separate from PNV with juice.   5. Depression affecting pregnancy -Denies signs of depression, will continue to monitor.   Term labor symptoms and general obstetric precautions including but not limited to vaginal bleeding, contractions, leaking of fluid and fetal movement were reviewed in detail with the patient. Please refer to After Visit Summary for other counseling recommendations.   Return in about 1 week (around 06/02/2022) for Routine prenatal care visit.  Future Appointments  Date Time Provider Department Center  05/27/2022  1:00 PM [facility] US[facility]  05/27/2022  2:00 PM [facility] CONSULT RM [facility] None  06/02/2022  1:40 PM [facility] PROVIDER AC-MAT None    Glenna Fellows, FNP

## 2022-05-27 ENCOUNTER — Ambulatory Visit: Payer: Self-pay | Attending: Maternal & Fetal Medicine

## 2022-05-27 ENCOUNTER — Ambulatory Visit (HOSPITAL_BASED_OUTPATIENT_CLINIC_OR_DEPARTMENT_OTHER): Payer: Self-pay | Admitting: Maternal & Fetal Medicine

## 2022-05-27 ENCOUNTER — Other Ambulatory Visit: Payer: Self-pay

## 2022-05-27 VITALS — BP 107/77 | HR 86 | Temp 98.3°F | Ht 62.0 in | Wt 208.5 lb

## 2022-05-27 DIAGNOSIS — O24410 Gestational diabetes mellitus in pregnancy, diet controlled: Secondary | ICD-10-CM

## 2022-05-27 DIAGNOSIS — O26843 Uterine size-date discrepancy, third trimester: Secondary | ICD-10-CM | POA: Insufficient documentation

## 2022-05-27 DIAGNOSIS — O0990 Supervision of high risk pregnancy, unspecified, unspecified trimester: Secondary | ICD-10-CM

## 2022-05-27 DIAGNOSIS — O99210 Obesity complicating pregnancy, unspecified trimester: Secondary | ICD-10-CM

## 2022-05-27 DIAGNOSIS — Z3A34 34 weeks gestation of pregnancy: Secondary | ICD-10-CM | POA: Insufficient documentation

## 2022-05-27 DIAGNOSIS — O99810 Abnormal glucose complicating pregnancy: Secondary | ICD-10-CM

## 2022-05-27 DIAGNOSIS — Z363 Encounter for antenatal screening for malformations: Secondary | ICD-10-CM | POA: Insufficient documentation

## 2022-05-27 DIAGNOSIS — O09523 Supervision of elderly multigravida, third trimester: Secondary | ICD-10-CM | POA: Insufficient documentation

## 2022-05-27 DIAGNOSIS — O24419 Gestational diabetes mellitus in pregnancy, unspecified control: Secondary | ICD-10-CM | POA: Insufficient documentation

## 2022-05-27 NOTE — Progress Notes (Signed)
MFM Consultation  Shirley Park is a 36 yo G5P3 at 62w1dwith an EDD of 07/07/2022.  She is seen at the request of EDonnal Moat CNM regarding management of A1GDM and detailed ultrasound.  Her pregnancy is complicated by AM5HQIand poor health literacy.    Her pregnancy related issues:  A1GDM:   She is currently doing well on her diet. Her most recent [employer] LLCvisit recorded normal 2hr PP and 3/6 FBS slightly elevated 90-100. She walks 60 minutes every day.   Ms. EStoney Bangconveyed through the interpreter that she understands her goals and eats appropriately.    Vital BP: 107/77 Hg. OB History  Gravida Para Term Preterm AB Living  5 3 3  0 1 3  SAB IAB Ectopic Multiple Live Births  1 0 0 0 3    # Outcome Date GA Lbr Len/2nd Weight Sex Delivery Anes PTL Lv  5 Current           4 SAB 08/2021          3 Term 02/14/19 422w2d 00:23 3800 g M Vag-Spont None  LIV     Apgar1: 3  Apgar5: 9  2 Term 08/27/10 [id number] Vag-Spont   LIV  1 Term 10/31/03 [id number] Vag-Spont   LIV   Past Medical History:  Diagnosis Date   Anemia affecting pregnancy in first trimester 12/30/2021   ASCUS with positive high risk HPV cervical    2016, 2018, 2019   Cervical dysplasia    Depression affecting pregnancy 05/02/2022   Gestational diabetes 04/18/22 04/21/2022   History of spontaneous abortion    08/2021   Past Surgical History:  Procedure Laterality Date   Denies surgical hx         Current Outpatient Medications (Analgesics):    aspirin EC 81 MG tablet, Take 81 mg by mouth daily. Swallow whole.  Current Outpatient Medications (Hematological):    Iron, Ferrous Sulfate, 325 (65 Fe) MG TABS, Take 1 tablet by mouth in the morning, at noon, and at bedtime.  Current Outpatient Medications (Other):    blood glucose meter kit and supplies KIT, Dispense based on patient and insurance preference. Use up to four times daily as directed.   Glucose Blood (BLOOD GLUCOSE TEST STRIPS) STRP, 1 Units by In Vitro  route in the morning, at noon, in the evening, and at bedtime.   Lancets MISC, 1 Units by Does not apply route in the morning, at noon, in the evening, and at bedtime. Dispense insurance approves, Medicaid approved or least expensive lancets   Prenatal MV & Min w/FA-DHA (PRENATAL GUMMIES PO), Take by mouth. Family History  Problem Relation Age of Onset   Diabetes Mother    Alcohol abuse Father    Healthy Sister    Healthy Sister    Healthy Sister    Healthy Sister    Healthy Sister    Healthy Sister    Healthy Brother    Healthy Brother    Healthy Brother    Healthy Brother    Healthy Brother    Healthy Brother    Healthy Brother    Healthy Brother    Healthy Daughter    Healthy Daughter    Healthy Son    Stroke Paternal Grandfather    Social History   Socioeconomic History   Marital status: Single    Spouse name: SalvFlossie Dibbleumber of children: 3   Years of education: 1st grade in  [place] (left school in 2nd grade, did not complete that year)   Highest education level: 1st grade  Occupational History   Occupation: Product manager    Comment: Part-Time  Tobacco Use   Smoking status: Never    Passive exposure: Never   Smokeless tobacco: Never  Vaping Use   Vaping Use: Never used  Substance and Sexual Activity   Alcohol use: Not Currently    Alcohol/week: 2.0 standard drinks of alcohol    Types: 2 Cans of beer per week    Comment: occasionally on gatherings - last ETOH before pregnancy   Drug use: Never   Sexual activity: Yes    Birth control/protection: Condom    Comment: Last BCM was condoms  Other Topics Concern   Not on file  Social History Narrative   Lives at home with her 3 children and with brother in-law [name]).    FOB is not in relationship with patient, however she says she can ask him for help when needed. Same FOB Mervin Hack)  as 36 year old.    Social Determinants of Health   Financial Resource Strain: Medium Risk  (12/30/2021)   Overall Financial Resource Strain (CARDIA)    Difficulty of Paying Living Expenses: Somewhat hard  Food Insecurity: No Food Insecurity (12/30/2021)   Hunger Vital Sign    Worried About Running Out of Food in the Last Year: Never true    Ran Out of Food in the Last Year: Never true  Transportation Needs: No Transportation Needs (12/30/2021)   PRAPARE - Hydrologist (Medical): No    Lack of Transportation (Non-Medical): No  Physical Activity: Not on file  Stress: Not on file  Social Connections: Not on file  Intimate Partner Violence: Not At Risk (03/21/2022)   Humiliation, Afraid, Rape, and Kick questionnaire    Fear of Current or Ex-Partner: No    Emotionally Abused: No    Physically Abused: No    Sexually Abused: No   No Known Allergies   Imaging:  A single intrauterine pregnancy was observed with measurements consistent with dates.  Suboptimal views of the fetal anatomy was obtained secondary to fetal position There is good fetal movement and amniotic fluid volume.   Impression/Counseling:  I discussed with Shirley Park the normal nature of today's exam. I also discussed with her that there are limitation to today's exam given the advanced gestational age.  Shirley Park has reasonably controlled A1GDM. She was diagnosed given a elevated fasting of 101, a 1hr 203 and 2 hour 183.   < 25% of her blood sugars are elevated however, recently 50% of her FBS are abnormal. She was able to explain the blood sugar targets and how she is managing it.   I discussed that complications in pregnancy related to A1GDM include fetal macrosomia, birth trauma with temporary or permanent nerve damage, cesarean delivery and NICU admission.   I discussed that if her BS values are > 25-50% abnormal we would recommend initiating medical therapy via metformin.   IF medical therapy is initiated we recommend weekly testing with delivery between 37-39 weeks.   I  recommended repeat growth in 4 weeks. However, Shirley Park expressed that she was self pay and may not be able to afford it. She reports that she will see and cancel the appointment if cost was a barrier. She is also in contact with a Development worker, community.   This visit was through [place] interpreter.   I spent  30 min with > 50% in face to face consultation.  All questions answered.   Vikki Ports, MD

## 2022-06-02 ENCOUNTER — Ambulatory Visit: Payer: Self-pay | Admitting: Advanced Practice Midwife

## 2022-06-02 VITALS — BP 109/75 | HR 93 | Temp 97.5°F | Wt 210.2 lb

## 2022-06-02 DIAGNOSIS — O99011 Anemia complicating pregnancy, first trimester: Secondary | ICD-10-CM

## 2022-06-02 DIAGNOSIS — O0990 Supervision of high risk pregnancy, unspecified, unspecified trimester: Secondary | ICD-10-CM

## 2022-06-02 DIAGNOSIS — Z550 Illiteracy and low-level literacy: Secondary | ICD-10-CM

## 2022-06-02 DIAGNOSIS — O24410 Gestational diabetes mellitus in pregnancy, diet controlled: Secondary | ICD-10-CM

## 2022-06-02 DIAGNOSIS — O0993 Supervision of high risk pregnancy, unspecified, third trimester: Secondary | ICD-10-CM

## 2022-06-02 DIAGNOSIS — O09523 Supervision of elderly multigravida, third trimester: Secondary | ICD-10-CM

## 2022-06-02 DIAGNOSIS — O09529 Supervision of elderly multigravida, unspecified trimester: Secondary | ICD-10-CM

## 2022-06-02 DIAGNOSIS — O99210 Obesity complicating pregnancy, unspecified trimester: Secondary | ICD-10-CM

## 2022-06-02 DIAGNOSIS — O99013 Anemia complicating pregnancy, third trimester: Secondary | ICD-10-CM

## 2022-06-02 DIAGNOSIS — O99213 Obesity complicating pregnancy, third trimester: Secondary | ICD-10-CM

## 2022-06-02 LAB — URINALYSIS
Bilirubin, UA: NEGATIVE
Glucose, UA: NEGATIVE
Ketones, UA: NEGATIVE
Leukocytes,UA: NEGATIVE
Nitrite, UA: NEGATIVE
Protein,UA: NEGATIVE
RBC, UA: NEGATIVE
Specific Gravity, UA: 1.01 (ref 1.005–1.030)
Urobilinogen, Ur: 0.2 mg/dL (ref 0.2–1.0)
pH, UA: 7 (ref 5.0–7.5)

## 2022-06-02 LAB — HEMOGLOBIN, FINGERSTICK: Hemoglobin: 11.9 g/dL (ref 11.1–15.9)

## 2022-06-02 NOTE — Progress Notes (Signed)
[place] [facility]  PRENATAL VISIT NOTE  Subjective:  Shirley Park is a 36 y.o. [phone] at [redacted]w[redacted]d being seen today for ongoing prenatal care.  She is currently monitored for the following issues for this high-risk pregnancy and has Cervical dysplasia; Illiterate: can't read or write or sign her name; Obesity affecting pregnancy BMI=35.5; Advanced maternal age in multigravida 36 yo; Missed abortion with fetal demise before 19 completed weeks of gestation; Anemia affecting pregnancy in first trimester; Supervision of high risk pregnancy, antepartum; Abnormal glucose affecting pregnancy; Gestational diabetes 04/18/22 at 28 4/7; and Depression affecting pregnancy on their problem list.  Patient reports no complaints.  Contractions: Not present. Vag. Bleeding: None.  Movement: Present. Denies leaking of fluid/ROM.   The following portions of the patient's history were reviewed and updated as appropriate: allergies, current medications, past family history, past medical history, past social history, past surgical history and problem list. Problem list updated.  Objective:   Vitals:   06/02/22 1334  BP: 109/75  Pulse: 93  Temp: (!) 97.5 F (36.4 C)  Weight: 210 lb 3.2 oz (95.3 kg)    Fetal Status: Fetal Heart Rate (bpm): 125 Fundal Height: 39 cm Movement: Present     General:  Alert, oriented and cooperative. Patient is in no acute distress.  Skin: Skin is warm and dry. No rash noted.   Cardiovascular: Normal heart rate noted  Respiratory: Normal respiratory effort, no problems with respiration noted  Abdomen: Soft, gravid, appropriate for gestational age.  Pain/Pressure: Absent     Pelvic: Cervical exam deferred        Extremities: Normal range of motion.  Edema: None  Mental Status: Normal mood and affect. Normal behavior. Normal judgment and thought content.   Assessment and Plan:  Pregnancy: [id number] at [redacted]w[redacted]d  1. Diet controlled  gestational diabetes mellitus (GDM) in third trimester Blood sugar log values below. Encouraged continued daily exercise and diet modifications. 1 of  8 FBS values are abnormal (range was 80 to 100) 0 of 23 2hr pp values are abnormal (range was 77 to 112) Exercise: walks 30. minutes per day 3x/wk Diet recall :   Breakfast = 2 hot dogs without buns, 1 avocado, cheese, 2% milk                      Lunch = fish, avocado, cheese, salad, 1 banana, water                      Dinner = salad, 1 chicken breast, avocado, cheese, 1 red apple, water                      Snack =nuts, 2 kiwis, water  Water: drinks about  8-9 bottles of water/day and night   DNKA 04/22/22 Lifestyles apt but kept 05/02/22 apt - Urinalysis (Urine Dip)  2. Supervision of high risk pregnancy, antepartum 20 lb 3.2 oz (9.163 kg) Reviewed 05/27/22 u/s at 34 1/7 with AFI wnl, EFW=33% Has u/s 06/24/22  3. Obesity affecting pregnancy, antepartum, unspecified obesity type 20 lb 3.2 oz (9.163 kg) Gained 2 lbs in last week Taking ASA 81 mg daily  4. Antepartum multigravida of advanced maternal age 8 yo Declined genetic counseling and all screening  5. Illiterate: can't read or write or sign her name   6. Anemia affecting pregnancy in first trimester Taking FeSo4 I daily with oj - Hemoglobin, venipuncture   Preterm  labor symptoms and general obstetric precautions including but not limited to vaginal bleeding, contractions, leaking of fluid and fetal movement were reviewed in detail with the patient. Please refer to After Visit Summary for other counseling recommendations.  Return in about 1 week (around 06/09/2022) for routine PNC.  Future Appointments  Date Time Provider [place]  06/24/2022  3:00 PM [facility] US1 [facility] [place], CNM

## 2022-06-02 NOTE — Progress Notes (Signed)
Kept 05/27/2022 [place] appt at [facility] MFM and aware of next [place] appt on 06/24/22 at 1500. Blood sugar log copied and in outguide for provider. Urine dip today. Correctly verbalizes how to take PNV and iron. Taking iron with juice. Rich Number, RN Hgb ordered by Ola Spurr CNM reviewed. Urine dip reviewed by E. Sciora CNM. Rich Number, RN

## 2022-06-09 ENCOUNTER — Ambulatory Visit: Payer: Self-pay | Admitting: Advanced Practice Midwife

## 2022-06-09 VITALS — BP 110/73 | HR 81 | Temp 97.2°F | Wt 211.0 lb

## 2022-06-09 DIAGNOSIS — Z550 Illiteracy and low-level literacy: Secondary | ICD-10-CM

## 2022-06-09 DIAGNOSIS — O0990 Supervision of high risk pregnancy, unspecified, unspecified trimester: Secondary | ICD-10-CM

## 2022-06-09 DIAGNOSIS — O09529 Supervision of elderly multigravida, unspecified trimester: Secondary | ICD-10-CM

## 2022-06-09 DIAGNOSIS — O99210 Obesity complicating pregnancy, unspecified trimester: Secondary | ICD-10-CM

## 2022-06-09 DIAGNOSIS — O99340 Other mental disorders complicating pregnancy, unspecified trimester: Secondary | ICD-10-CM

## 2022-06-09 DIAGNOSIS — O99213 Obesity complicating pregnancy, third trimester: Secondary | ICD-10-CM

## 2022-06-09 DIAGNOSIS — O99343 Other mental disorders complicating pregnancy, third trimester: Secondary | ICD-10-CM

## 2022-06-09 DIAGNOSIS — F32A Depression, unspecified: Secondary | ICD-10-CM

## 2022-06-09 DIAGNOSIS — O0993 Supervision of high risk pregnancy, unspecified, third trimester: Secondary | ICD-10-CM

## 2022-06-09 DIAGNOSIS — O99013 Anemia complicating pregnancy, third trimester: Secondary | ICD-10-CM

## 2022-06-09 DIAGNOSIS — O99011 Anemia complicating pregnancy, first trimester: Secondary | ICD-10-CM

## 2022-06-09 DIAGNOSIS — O09523 Supervision of elderly multigravida, third trimester: Secondary | ICD-10-CM

## 2022-06-09 DIAGNOSIS — O24410 Gestational diabetes mellitus in pregnancy, diet controlled: Secondary | ICD-10-CM

## 2022-06-09 LAB — URINALYSIS
Bilirubin, UA: NEGATIVE
Glucose, UA: NEGATIVE
Ketones, UA: NEGATIVE
Leukocytes,UA: NEGATIVE
Nitrite, UA: NEGATIVE
Protein,UA: NEGATIVE
RBC, UA: NEGATIVE
Specific Gravity, UA: 1.01 (ref 1.005–1.030)
Urobilinogen, Ur: 0.2 mg/dL (ref 0.2–1.0)
pH, UA: 6 (ref 5.0–7.5)

## 2022-06-09 NOTE — Progress Notes (Signed)
[place] [facility]  PRENATAL VISIT NOTE  Subjective:  Shirley Park is a 36 y.o. [phone] at [redacted]w[redacted]d being seen today for ongoing prenatal care.  She is currently monitored for the following issues for this high-risk pregnancy and has Cervical dysplasia; Illiterate: can't read or write or sign her name; Obesity affecting pregnancy BMI=35.5; Advanced maternal age in multigravida 36 yo; Missed abortion with fetal demise before 14 completed weeks of gestation; Anemia affecting pregnancy in first trimester; Supervision of high risk pregnancy, antepartum; Abnormal glucose affecting pregnancy; Gestational diabetes 04/18/22 at 28 4/7; and Depression affecting pregnancy on their problem list.  Patient reports no complaints.  Contractions: Not present. Vag. Bleeding: None.  Movement: Present. Denies leaking of fluid/ROM.   The following portions of the patient's history were reviewed and updated as appropriate: allergies, current medications, past family history, past medical history, past social history, past surgical history and problem list. Problem list updated.  Objective:   Vitals:   06/09/22 1412  BP: 110/73  Pulse: 81  Temp: (!) 97.2 F (36.2 C)  Weight: 211 lb (95.7 kg)    Fetal Status: Fetal Heart Rate (bpm): 160 Fundal Height: 39 cm Movement: Present     General:  Alert, oriented and cooperative. Patient is in no acute distress.  Skin: Skin is warm and dry. No rash noted.   Cardiovascular: Normal heart rate noted  Respiratory: Normal respiratory effort, no problems with respiration noted  Abdomen: Soft, gravid, appropriate for gestational age.  Pain/Pressure: Absent     Pelvic: Cervical exam deferred        Extremities: Normal range of motion.  Edema: None  Mental Status: Normal mood and affect. Normal behavior. Normal judgment and thought content.   Assessment and Plan:  Pregnancy: [id number] at [redacted]w[redacted]d  1. Diet controlled gestational  diabetes mellitus (GDM) in third trimester This provider is not confident that pt is knowledgeable/literate enough to be checking blood sugars accurately. I have asked her to bring her glucometer in to next apt to double check.  All blood sugars are in 80-90's. Pt doesn't know what day it is today, missed taking any blood sugars yesterday, can't remember when to check blood sugars  Blood sugar log values below. Encouraged continued daily exercise and diet modifications. 0 of  7 FBS values are abnormal (range was 80 to 82) 0 of 20 2hr pp values are abnormal (range was 78 to 103) Exercise: walks 3x/wk x 30-60  minutes per day Diet recall :   Breakfast = 2 eggs, avocado, 3 hot dogs, 2 pieces bread, milk                      Lunch = chicken soup, water, tortilla, 2 kiwi                      Dinner = salad, meat, 2 hard boiled eggs, water                      Snack = 1 green apple, almonds, water Water: drinks about  10 8 oz bottles of water/day and night  - Urinalysis: neg glucose, neg ketones    2. Illiterate: can't read or write or sign her name   3. Supervision of high risk pregnancy, antepartum [facility] 05/06/22 u/s Reviewed 05/27/22 u/s at 33 1/7 with anterior placenta, AFI wnl, EFW=33%, 3VC, vtx with recommendation if needs Metformin then needs weekly AP testing  and delivery at 37-39 wks Next u/s 06/24/22 GC/Chlamydia/GBS cultures obtained  4. Obesity affecting pregnancy, antepartum, unspecified obesity type 21 lb (9.526 kg) Taking ASA 81 mg daily  5. Antepartum multigravida of advanced maternal age 46 yo Declines NIPS  6. Anemia affecting pregnancy in first trimester Taking FeSo4 I daily with oj  7. Depression affecting pregnancy States doesn't want counseling with [place] now   Preterm labor symptoms and general obstetric precautions including but not limited to vaginal bleeding, contractions, leaking of fluid and fetal movement were reviewed in detail with the  patient. Please refer to After Visit Summary for other counseling recommendations.  Return in about 1 week (around 06/16/2022) for routine PNC.  Future Appointments  Date Time Provider [place]  06/24/2022  3:00 PM [facility] [place], CNM

## 2022-06-09 NOTE — Progress Notes (Addendum)
Patient here for MH RV at 36 weeks. Provider to collect labs today. Urine dip today, BS log copied. Aware of [facility] 06/24/22. Counseled to stop taking ASA.Jenetta Downer, RN

## 2022-06-11 LAB — CHLAMYDIA/GC NAA, CONFIRMATION
Chlamydia trachomatis, NAA: NEGATIVE
Neisseria gonorrhoeae, NAA: NEGATIVE

## 2022-06-13 LAB — CULTURE, BETA STREP (GROUP B ONLY): Strep Gp B Culture: NEGATIVE

## 2022-06-18 ENCOUNTER — Observation Stay: Payer: Medicaid Other

## 2022-06-18 ENCOUNTER — Other Ambulatory Visit: Payer: Self-pay

## 2022-06-18 ENCOUNTER — Ambulatory Visit: Payer: Self-pay | Admitting: Advanced Practice Midwife

## 2022-06-18 ENCOUNTER — Encounter: Payer: Self-pay | Admitting: Obstetrics and Gynecology

## 2022-06-18 ENCOUNTER — Inpatient Hospital Stay
Admission: EM | Admit: 2022-06-18 | Discharge: 2022-06-20 | DRG: 806 | Disposition: A | Discharge status: Home / Self Care | Payer: Medicaid Other | Attending: Certified Nurse Midwife | Admitting: Certified Nurse Midwife

## 2022-06-18 VITALS — BP 110/67 | HR 79 | Temp 96.6°F | Wt 211.0 lb

## 2022-06-18 DIAGNOSIS — O4103X0 Oligohydramnios, third trimester, not applicable or unspecified: Secondary | ICD-10-CM | POA: Diagnosis present

## 2022-06-18 DIAGNOSIS — O24429 Gestational diabetes mellitus in childbirth, unspecified control: Principal | ICD-10-CM | POA: Diagnosis present

## 2022-06-18 DIAGNOSIS — O9902 Anemia complicating childbirth: Secondary | ICD-10-CM | POA: Diagnosis present

## 2022-06-18 DIAGNOSIS — Z550 Illiteracy and low-level literacy: Secondary | ICD-10-CM | POA: Diagnosis not present

## 2022-06-18 DIAGNOSIS — Z3689 Encounter for other specified antenatal screening: Secondary | ICD-10-CM

## 2022-06-18 DIAGNOSIS — O99892 Other specified diseases and conditions complicating childbirth: Secondary | ICD-10-CM | POA: Diagnosis present

## 2022-06-18 DIAGNOSIS — O99214 Obesity complicating childbirth: Secondary | ICD-10-CM | POA: Diagnosis present

## 2022-06-18 DIAGNOSIS — O0990 Supervision of high risk pregnancy, unspecified, unspecified trimester: Secondary | ICD-10-CM

## 2022-06-18 DIAGNOSIS — O09529 Supervision of elderly multigravida, unspecified trimester: Secondary | ICD-10-CM

## 2022-06-18 DIAGNOSIS — O99210 Obesity complicating pregnancy, unspecified trimester: Secondary | ICD-10-CM

## 2022-06-18 DIAGNOSIS — Z3A37 37 weeks gestation of pregnancy: Secondary | ICD-10-CM | POA: Diagnosis not present

## 2022-06-18 DIAGNOSIS — O99013 Anemia complicating pregnancy, third trimester: Secondary | ICD-10-CM

## 2022-06-18 DIAGNOSIS — O99011 Anemia complicating pregnancy, first trimester: Secondary | ICD-10-CM

## 2022-06-18 DIAGNOSIS — O24419 Gestational diabetes mellitus in pregnancy, unspecified control: Secondary | ICD-10-CM | POA: Diagnosis present

## 2022-06-18 DIAGNOSIS — O0993 Supervision of high risk pregnancy, unspecified, third trimester: Secondary | ICD-10-CM

## 2022-06-18 DIAGNOSIS — O99213 Obesity complicating pregnancy, third trimester: Secondary | ICD-10-CM

## 2022-06-18 DIAGNOSIS — O24410 Gestational diabetes mellitus in pregnancy, diet controlled: Secondary | ICD-10-CM

## 2022-06-18 DIAGNOSIS — O09523 Supervision of elderly multigravida, third trimester: Secondary | ICD-10-CM

## 2022-06-18 HISTORY — DX: Gestational diabetes mellitus in pregnancy, unspecified control: O24.419

## 2022-06-18 LAB — GLUCOSE, CAPILLARY: Glucose-Capillary: 77 mg/dL (ref 70–99)

## 2022-06-18 LAB — URINALYSIS
Bilirubin, UA: NEGATIVE
Glucose, UA: NEGATIVE
Ketones, UA: NEGATIVE
Leukocytes,UA: NEGATIVE
Nitrite, UA: NEGATIVE
Protein,UA: NEGATIVE
RBC, UA: NEGATIVE
Specific Gravity, UA: 1.015 (ref 1.005–1.030)
Urobilinogen, Ur: 0.2 mg/dL (ref 0.2–1.0)
pH, UA: 7 (ref 5.0–7.5)

## 2022-06-18 LAB — TYPE AND SCREEN
ABO/RH(D): B POS
Antibody Screen: NEGATIVE

## 2022-06-18 LAB — CBC
HCT: 37.8 % (ref 36.0–46.0)
Hemoglobin: 12.1 g/dL (ref 12.0–15.0)
MCH: 25.3 pg — ABNORMAL LOW (ref 26.0–34.0)
MCHC: 32 g/dL (ref 30.0–36.0)
MCV: 79.1 fL — ABNORMAL LOW (ref 80.0–100.0)
Platelets: 338 10*3/uL (ref 150–400)
RBC: 4.78 MIL/uL (ref 3.87–5.11)
RDW: 16.4 % — ABNORMAL HIGH (ref 11.5–15.5)
WBC: 11.1 10*3/uL — ABNORMAL HIGH (ref 4.0–10.5)
nRBC: 0 % (ref 0.0–0.2)

## 2022-06-18 MED ORDER — LACTATED RINGERS IV SOLN: INTRAVENOUS | Status: DC

## 2022-06-18 MED ORDER — FENTANYL CITRATE (PF) 100 MCG/2ML IJ SOLN: 50.0000 ug | INTRAMUSCULAR | Status: DC | PRN

## 2022-06-18 MED ORDER — DEXTROSE 50 % IV SOLN: 0.0000 mL | INTRAVENOUS | Status: DC | PRN

## 2022-06-18 MED ORDER — SOD CITRATE-CITRIC ACID 500-334 MG/5ML PO SOLN: 30.0000 mL | ORAL | Status: DC | PRN

## 2022-06-18 MED ORDER — LIDOCAINE HCL (PF) 1 % IJ SOLN: 30.0000 mL | INTRAMUSCULAR | Status: DC | PRN

## 2022-06-18 MED ORDER — MISOPROSTOL 25 MCG QUARTER TABLET
25.0000 ug | ORAL_TABLET | ORAL | Status: DC
  Administered 2022-06-18 – 2022-06-19 (×2): 25 ug via ORAL
  Filled 2022-06-18 (×3): qty 1

## 2022-06-18 MED ORDER — OXYCODONE-ACETAMINOPHEN 5-325 MG PO TABS: 2.0000 | ORAL_TABLET | ORAL | Status: DC | PRN

## 2022-06-18 MED ORDER — LACTATED RINGERS IV SOLN
500.0000 mL | INTRAVENOUS | Status: DC | PRN
  Administered 2022-06-19: 1000 mL via INTRAVENOUS

## 2022-06-18 MED ORDER — INSULIN REGULAR(HUMAN) IN NACL 100-0.9 UT/100ML-% IV SOLN
INTRAVENOUS | Status: DC
  Filled 2022-06-18 (×2): qty 100

## 2022-06-18 MED ORDER — OXYTOCIN BOLUS FROM INFUSION
333.0000 mL | Freq: Once | INTRAVENOUS | Status: AC
  Administered 2022-06-19: 333 mL via INTRAVENOUS

## 2022-06-18 MED ORDER — DEXTROSE IN LACTATED RINGERS 5 % IV SOLN: INTRAVENOUS | Status: DC

## 2022-06-18 MED ORDER — MISOPROSTOL 25 MCG QUARTER TABLET
25.0000 ug | ORAL_TABLET | ORAL | Status: DC
  Administered 2022-06-18 – 2022-06-19 (×2): 25 ug via VAGINAL
  Filled 2022-06-18 (×2): qty 1

## 2022-06-18 MED ORDER — TERBUTALINE SULFATE 1 MG/ML IJ SOLN: 0.2500 mg | Freq: Once | INTRAMUSCULAR | Status: DC | PRN

## 2022-06-18 MED ORDER — OXYCODONE-ACETAMINOPHEN 5-325 MG PO TABS: 1.0000 | ORAL_TABLET | ORAL | Status: DC | PRN

## 2022-06-18 MED ORDER — ONDANSETRON HCL 4 MG/2ML IJ SOLN: 4.0000 mg | Freq: Four times a day (QID) | INTRAMUSCULAR | Status: DC | PRN

## 2022-06-18 MED ORDER — OXYTOCIN-SODIUM CHLORIDE 30-0.9 UT/500ML-% IV SOLN
1.0000 m[IU]/min | INTRAVENOUS | Status: DC
  Administered 2022-06-19: 2 m[IU]/min via INTRAVENOUS
  Filled 2022-06-18: qty 500

## 2022-06-18 MED ORDER — OXYTOCIN-SODIUM CHLORIDE 30-0.9 UT/500ML-% IV SOLN: 2.5000 [IU]/h | INTRAVENOUS | Status: DC

## 2022-06-18 MED ORDER — ACETAMINOPHEN 325 MG PO TABS: 650.0000 mg | ORAL_TABLET | ORAL | Status: DC | PRN

## 2022-06-18 NOTE — Progress Notes (Signed)
Patient ID: Shirley Park, female   DOB: [birth year], 36 y.o.   MRN: [id number] Spoke with CNM at [place] about this patient 37+2 week Illiterate patient and A1GDM but she has not been tracking Glucose . Presumed to be uncontrolled . U/s results seen today . Decrease AFI . Admit for induction . CNM Ox[place] aware and is managing pt with me .

## 2022-06-18 NOTE — Progress Notes (Signed)
Bedside u/s performed by me to verify presentation of fetus, which is cephalic.  Fluid subjectively normal.   Obtaining formal growth and afi u/s.   Prentice Docker, MD, [facility] 06/18/2022 2:02 PM

## 2022-06-18 NOTE — Progress Notes (Addendum)
Urine dip reviewed - no treatment indicated.   Copy of blood sugar log made. Patient brought glucometer and nurse logged values from glucometer which defer from the value patient wrote. Copy made and filed for scanning.   Patient is reminded of upcoming [place] [facility] on 06/24/22 at 3pm. Patient states she is unsure of keeping [place] appt due to financial worries.   Al Decant, RN

## 2022-06-18 NOTE — H&P (Signed)
OB History & Physical   History of Present Illness:  Chief Complaint:   HPI:  Danya Spearman is a 36 y.o. [phone] female at 39w2ddated by 34 weeks u/s.  She presents to L&D for uncontrolled GDM at term.  She reports:  -active fetal movement -no leakage of fluid -no vaginal bleeding -no contractions  Pregnancy Issues: 1. GDM 2. Obesity 3. Illiterate 4. Depression 5. Cervical dysplasia 6. AMA 7. Anemia   Maternal Medical History:   Past Medical History:  Diagnosis Date   Anemia affecting pregnancy in first trimester 12/30/2021   ASCUS with positive high risk HPV cervical    2016, 2018, 2019   Cervical dysplasia    Depression affecting pregnancy 05/02/2022   Gestational diabetes 04/18/22 04/21/2022   History of spontaneous abortion    08/2021    Past Surgical History:  Procedure Laterality Date   Denies surgical hx      No Known Allergies  Prior to Admission medications   Medication Sig Start Date End Date Taking? Authorizing Provider  blood glucose meter kit and supplies KIT Dispense based on patient and insurance preference. Use up to four times daily as directed. 04/21/22   Sciora, EReal Cons CNM  Glucose Blood (BLOOD GLUCOSE TEST STRIPS) STRP 1 Units by In Vitro route in the morning, at noon, in the evening, and at bedtime. 04/21/22   Sciora, EReal Cons CNM  Iron, Ferrous Sulfate, 325 (65 Fe) MG TABS Take 1 tablet by mouth in the morning, at noon, and at bedtime. 02/21/22   NCaren Macadam MD  Lancets MISC 1 Units by Does not apply route in the morning, at noon, in the evening, and at bedtime. Dispense insurance approves, Medicaid approved or least expensive lancets 04/21/22   Sciora, EReal Cons CNM  Prenatal MV & Min w/FA-DHA (PRENATAL GUMMIES PO) Take by mouth.    [provider]     [facility]   Social History: She  reports that she has never smoked. She has never been exposed to tobacco smoke. She  has never used smokeless tobacco. She reports that she does not currently use alcohol after a past usage of about 2.0 standard drinks of alcohol per week. She reports that she does not use drugs.  Family History: family history includes Alcohol abuse in her father; Diabetes in her mother; Healthy in her brother, brother, brother, brother, brother, brother, brother, brother, daughter, daughter, sister, sister, sister, sister, sister, sister, and son; Stroke in her paternal grandfather.   Review of Systems: A full review of systems was performed and negative except as noted in the HPI.    Physical Exam:  Vital Signs: BP 107/88 (BP Location: Right Arm)   Pulse 81   Temp 98.6 F (37 C) (Oral)   Resp 20   Ht _0  (1.6 m)   Wt 95.7 kg   LMP 10/03/2021 (Approximate)   SpO2 100%   BMI 37.38 kg/m   General:   alert and cooperative  Skin:  normal  Neurologic:    Alert & oriented x 3  Lungs:    Nl effort  Heart:   regular rate and rhythm  Abdomen:  soft, non-tender; bowel sounds normal; no masses,  no organomegaly  Extremities: : non-tender, symmetric, no edema bilaterally.      EFW: 3203g = 69%  Results for orders placed or performed during the hospital encounter of 06/18/22 (from the past 24 hour(s))  Glucose, capillary  Status: None   Collection Time: 06/18/22  1:24 PM  Result Value Ref Range   Glucose-Capillary 77 70 - 99 mg/dL    Pertinent Results:  Prenatal Labs: Blood type/Rh B pos  Antibody screen neg  Rubella Immune  Varicella Immune  RPR NR  HBsAg Neg  HIV NR  GC neg  Chlamydia neg  Genetic screening declined  1 hour GTT   3 hour GTT 101, 203, 183, 135  GBS neg   FHT: FHR: 145 bpm, variability: moderate,  accelerations:  Present,  decelerations:  Absent Category/reactivity:  Category I TOCO: none SVE: Dilation: Closed / Effacement (%): Thick / Station: -3    US OB Follow Up  Result Date: 06/18/2022 CLINICAL DATA:  Gestational diabetes EXAM: OBSTETRICAL  ULTRASOUND >14 WKS FINDINGS: Number of Fetuses: 1 Heart Rate:  126 bpm Movement: Yes Presentation: Cephalic Previa: No Placental Location: Anterior-fundal Amniotic Fluid (Subjective): Decreased Amniotic Fluid (Objective): AFI = 1.7 cm FETAL BIOMETRY BPD: 8.4cm 33w 4d HC:   33.1cm 37w 5d AC:   33.7cm 37w 4d FL:   7.6cm 38w 6d Current Mean GA: 37w 0d [place] EDC: 07/09/2022 Assigned GA:  36w 6d Assigned EDC: 07/10/2022 Estimated Fetal Weight:  3,203g 69%ile Maternal Findings: Cervix:  Obscured. IMPRESSION: 1. Single live intrauterine gestation in cephalic presentation. 2. Assigned gestational age of [redacted] weeks 6 days. Adequate interval growth. 3. Oligohydramnios with amniotic fluid index of 1.7 cm. These results will be called to the ordering clinician or representative by the Radiologist Assistant, and communication documented in the PACS or [employer]. Electronically Signed   By: Davina Poke D.O.   On: 06/18/2022 18:22   [place] MFM OB DETAIL +14 WK  Result Date: 05/27/2022 ----------------------------------------------------------------------  OBSTETRICS REPORT                       (Signed Final 05/27/2022 02:29 pm) ---------------------------------------------------------------------- Patient Info  ID #:       [id number]                          D.O.B.:  Aug 03, 1986 (36 yrs)  Name:       Kerry Dory                   Visit Date: 05/27/2022 01:28 pm              ESCOBAR ---------------------------------------------------------------------- Performed By  Attending:        Sander Nephew      Ref. Address:     [address]  [place]  Performed By:     Rodrigo Ran BS      [facility]                                 [facility] at                                                              [place] By:      [facility]                    Department ---------------------------------------------------------------------- Orders  #  Description                           Code        Ordered By  1  [place] MFM OB DETAIL +14 [place]               76811.01    [facility] ----------------------------------------------------------------------  #  Order #                     Accession #                Episode #  1  [id number]                   [id number]                 [id number] ---------------------------------------------------------------------- Indications  [redacted] weeks gestation of pregnancy                Z3A.34  Antenatal screening for malformations          Z36.3  Gestational diabetes in pregnancy, diet        O24.410  controlled  Uterine size-date discrepancy, third trimester O26.843  (S>D, 34 weeks at [redacted] weeks GA)  Advanced maternal age multigravida 9,         O66.523  third trimester ---------------------------------------------------------------------- Fetal Evaluation  Num Of Fetuses:         1  Fetal Heart Rate(bpm):  140  Cardiac Activity:       Observed  Presentation:           Cephalic  Placenta:               Anterior  P. Cord Insertion:      Visualized  Amniotic Fluid  AFI FV:      Within normal limits  AFI Sum(cm)     %Tile       Largest Pocket(cm)  11.34           29          3.53  RUQ(cm)       RLQ(cm)       LUQ(cm)        LLQ(cm)  3.25  3.23          3.53           1.33 ---------------------------------------------------------------------- Biometry  BPD:      78.9  mm     G. Age:  31w 5d        2.2  %    CI:        70.36   %    70 - 86                                                          FL/HC:      21.1   %    19.4 - 21.8  HC:    299.92   mm     G. Age:  33w 2d        4.9  %    HC/AC:      0.97        0.96 - 1.11  AC:    308.81   mm     G. Age:  34w 6d         74  %    FL/BPD:     80.2    %    71 - 87  FL:      63.28  mm     G. Age:  32w 5d         10  %    FL/AC:      20.5   %    20 - 24  HUM:      56.1  mm     G. Age:  32w 5d         27  %  LV:          4  mm  Est. FW:    2271  gm           5 lb     33  % ---------------------------------------------------------------------- OB History  Gravidity:    5         Term:   3        Prem:   0        SAB:   1  TOP:          0       Ectopic:  0        Living: 3 ---------------------------------------------------------------------- Gestational Age  LMP:           33w 5d        Date:  10/03/21                   EDD:   07/10/22  U/S Today:     33w 1d                                        EDD:   07/14/22  Best:          34w 1d     Det. By:  Previous Ultrasound      EDD:   07/07/22                                      (  03/31/22) ---------------------------------------------------------------------- Anatomy  Cranium:               Appears normal         Aortic Arch:            Appears normal  Cavum:                 Appears normal         Ductal Arch:            Not well visualized  Ventricles:            Appears normal         Diaphragm:              Appears normal  Choroid Plexus:        Appears normal         Stomach:                Appears normal, left                                                                        sided  Cerebellum:            Appears normal         Abdomen:                Appears normal  Posterior Fossa:       Appears normal         Abdominal Wall:         Not well visualized  Nuchal Fold:           Not applicable (>10    Cord Vessels:           Appears normal ([redacted]                         wks GA)                                        vessel cord)  Face:                  Appears normal         Kidneys:                Appear normal                         (orbits and profile)  Lips:                  Appears normal         Bladder:                Appears normal  Thoracic:              Appears normal         Spine:                  Not well  visualized  Heart:                 Appears normal  Upper Extremities:      LUE appears                         (4CH, axis, and                                normal, RUE nwv                         situs)  RVOT:                  Appears normal         Lower Extremities:      Appears normal  LVOT:                  Appears normal  Other:  Fetus appears to be a female. ---------------------------------------------------------------------- Cervix Uterus Adnexa  Cervix  Not visualized (advanced GA >24wks)  Uterus  No abnormality visualized.  Right Ovary  Not visualized.  Left Ovary  Not visualized.  Cul De Sac  No free fluid seen.  Adnexa  No abnormality visualized. ---------------------------------------------------------------------- Impression  MFM Consultation  Ms. Stoney Bang is a 37 yo G5P3 at 67w1dwith an EDD of  07/07/2022.  She is seen at the request of EDonnal Moat CNM  regarding management of A1GDM and detailed ultrasound.  Her pregnancy is complicated by AD1VOHand poor health  literacy.  Her pregnancy related issues:  A1GDM:  She is currently doing well on her diet. [facility] and 3/6 FBS slightly elevated 90-  100.  She walks 60 minutes every day.  Ms. EStoney Bangconveyed through the interpreter that she  understands her goals and eats appropriately.  Vital BP: 107/77 Hg.  Please see EPIC for consultation details.  Imaging:  A single intrauterine pregnancy was observed with  measurements consistent with dates.  Suboptimal views of the fetal anatomy was obtained  secondary to fetal position  There is good fetal movement and amniotic fluid volume.  Impression/Counseling:  I discussed with Ms. EStoney Bangthe normal nature of today's  exam. I also discussed with her that there are limitation to  today's exam given the advanced gestational age.  Ms. EStoney Banghas reasonably controlled A1GDM. She was  diagnosed given a elevated fasting of 101, a 1hr 203 and 2  hour 183.  < 25%  of her blood sugars are elevated however, recently  50% of her FBS are abnormal. She was able to explain the  blood sugar targets and how she is managing it.  I discussed that complications in pregnancy related to  A1GDM include fetal macrosomia, birth trauma with  temporary or permanent nerve damage, cesarean delivery  and NICU admission.  I discussed that if her BS values are > 25-50% abnormal we  would recommend initiating medical therapy via metformin.  IF medical therapy is initiated we recommend weekly testing  with delivery between 37-39 weeks.  This visit was through SRomaniainterpreter.  I spent 30 min with > 50% in face to face consultation.  All questions answered.  CVikki Ports MD ----------------------------------------------------------------------              CSander Nephew MD Electronically Signed Final Report   05/27/2022 02:29 pm ----------------------------------------------------------------------    Assessment:  JRhilee Currinis a 35y.o. [phone] at 346w2dith  GDM and oligo.   Plan:  1. Admit to Labor & Delivery; consents reviewed and obtained  2. Fetal Well being  - Fetal Tracing: Cat I - GBS neg - Presentation: vtx confirmed by u/s   3. Routine OB: - Prenatal labs reviewed, as above - Rh pos - CBC & T&S on admit - Clear fluids, IVF  4. Induction of Labor -  Contractions by external toco in place -  Pelvis proven to 3800g -  Plan for induction with cytotec -  Plan for continuous fetal monitoring  -  Maternal pain control as desired: IVPM, nitrous, regional anesthesia - Anticipate vaginal delivery  5. Post Partum Planning: - Infant feeding: formula - Contraception: Depo - Flu: declined - Tdap: not given  Rick Carruthers, CNM 06/18/2022 8:05 PM

## 2022-06-18 NOTE — Progress Notes (Signed)
[place] [facility]  PRENATAL VISIT NOTE  Subjective:  Shirley Park is a 36 y.o. [phone] at [redacted]w[redacted]d being seen today for ongoing prenatal care.  She is currently monitored for the following issues for this high-risk pregnancy and has Cervical dysplasia; Illiterate: can't read or write or sign her name; Obesity affecting pregnancy BMI=35.5; Advanced maternal age in multigravida 36 yo; Missed abortion with fetal demise before 60 completed weeks of gestation; Anemia affecting pregnancy in first trimester; Supervision of high risk pregnancy, antepartum; Abnormal glucose affecting pregnancy; Gestational diabetes 04/18/22 at 28 4/7; and Depression affecting pregnancy on their problem list.  Patient reports scratchy throat, cough  .  .  Movement: Present. Denies leaking of fluid/ROM.   The following portions of the patient's history were reviewed and updated as appropriate: allergies, current medications, past family history, past medical history, past social history, past surgical history and problem list. Problem list updated.  Objective:   Vitals:   06/18/22 0832  BP: 110/67  Pulse: 79  Temp: (!) 96.6 F (35.9 C)  Weight: 211 lb (95.7 kg)    Fetal Status: Fetal Heart Rate (bpm): 135 Fundal Height: 40 cm Movement: Present     General:  Alert, oriented and cooperative. Patient is in no acute distress.  Skin: Skin is warm and dry. No rash noted.   Cardiovascular: Normal heart rate noted  Respiratory: Normal respiratory effort, no problems with respiration noted  Abdomen: Soft, gravid, appropriate for gestational age.        Pelvic: Cervical exam performed        Extremities: Normal range of motion.  Edema: None  Mental Status: Normal mood and affect. Normal behavior. Normal judgment and thought content.   Assessment and Plan:  Pregnancy: [id number] at [redacted]w[redacted]d  1. Diet controlled gestational diabetes mellitus (GDM) in third trimester This provider  is sure that pt is not literate enough to be checking blood sugars accurately. Glucometer blood sugars are not accurately reflected in patients' blood sugar log, pt is not checking blood sugars 4x/day. Pt states has not checked FBS today but glucometer says todays FBS=96, no breakfast yet (1055) Blood sugar log values below (taken from glucometer). Encouraged continued daily exercise and diet modifications. 2 of  10 FBS values are abnormal (range was 76 to 104) 8 of 22 2hr pp values are abnormal (range was 82 to 145) Exercise: walks 10. minutes 2x/wk Diet recall :   Breakfast = nothing today yet                      Lunch = chicken soup, 1 tortilla, water                      Dinner = salad, chicken, 2 pieces bread, nuts, water                      Snack = 1 red apple, water  Water: drinks about  10 bottles of water/day and night  - Urinalysis (Urine Dip) neg glucose, neg ketones - Glucose, random drawn today with no results yet [facility] 05/06/22 u/s Reviewed 05/27/22 u/s at 33 1/7 with anterior placenta, AFI wnl, EFW=33%, 3VC Has growth u/s 06/24/22 but pt says she cannot go because she doesn't have money to pay for it Pt states hasn't eaten yet today and hasn't checked her FBS today  T/C consult with Dr. Ouida Sills re: uncontrolled gestational diabetic with unreliable BS's  on log due to illiteracy, possible breech Advised to send pt to L&D for u/s and IOL T/C to Linda Hedges, CNM to give report  2. Supervision of high risk pregnancy, antepartum Knows when to go to L&D Has crib and car seat Declined NIPS and AFP only 06/09/22 GC/Chlamydia/GBS neg 21 lb (9.526 kg) Living with employed FOB and her 3 kids Not working   3. Illiterate: can't read or write or sign her name   4. Anemia affecting pregnancy in first trimester Taking I FeSo4 daily with oj  5. Antepartum multigravida of advanced maternal age 25 yo Declined AFP only and NIPS  6. Obesity affecting pregnancy, antepartum,  unspecified obesity type Pt still taking ASA 81 mg daily even though was told by this provider last week to stop taking 21 lb (9.526 kg)    Term labor symptoms and general obstetric precautions including but not limited to vaginal bleeding, contractions, leaking of fluid and fetal movement were reviewed in detail with the patient. Please refer to After Visit Summary for other counseling recommendations.  No follow-ups on file.  Future Appointments  Date Time Provider [place]  06/24/2022  3:00 PM [facility] [place], CNM

## 2022-06-18 NOTE — OB Triage Note (Signed)
Shirley Park 36 y.o. presents to Labor & Delivery triage via wheelchair due to NST recommendation for health department due to uncontrolled GDM. She is a [id number] at [redacted]w[redacted]d . She denies signs and symptoms consistent with rupture of membranes or active vaginal bleeding. She denies contractions and states positive fetal movement. External FM and TOCO applied to non-tender abdomen. Initial FHR 140. Vital signs obtained and within normal limits. Patient oriented to care environment including call bell and bed control use. Jenn, CNM notified of patient's arrival. Plan to put in order for ultrasound to assess baby.

## 2022-06-18 NOTE — Plan of Care (Signed)
  Problem: Education: Goal: Knowledge of General Education information will improve Description: Including pain rating scale, medication(s)/side effects and non-pharmacologic comfort measures Outcome: Progressing   Problem: Health Behavior/Discharge Planning: Goal: Ability to manage health-related needs will improve Outcome: Progressing   Problem: Clinical Measurements: Goal: Ability to maintain clinical measurements within normal limits will improve Outcome: Progressing Goal: Will remain free from infection Outcome: Progressing Goal: Diagnostic test results will improve Outcome: Progressing Goal: Respiratory complications will improve Outcome: Progressing Goal: Cardiovascular complication will be avoided Outcome: Progressing   Problem: Activity: Goal: Risk for activity intolerance will decrease Outcome: Progressing   Problem: Nutrition: Goal: Adequate nutrition will be maintained Outcome: Progressing   Problem: Coping: Goal: Level of anxiety will decrease Outcome: Progressing   Problem: Elimination: Goal: Will not experience complications related to bowel motility Outcome: Progressing Goal: Will not experience complications related to urinary retention Outcome: Progressing   Problem: Pain Managment: Goal: General experience of comfort will improve Outcome: Progressing   Problem: Safety: Goal: Ability to remain free from injury will improve Outcome: Progressing   Problem: Skin Integrity: Goal: Risk for impaired skin integrity will decrease Outcome: Progressing   Problem: Education: Goal: Ability to describe self-care measures that may prevent or decrease complications (Diabetes Survival Skills Education) will improve Outcome: Progressing Goal: Individualized Educational Video(s) Outcome: Progressing   Problem: Coping: Goal: Ability to adjust to condition or change in health will improve Outcome: Progressing   Problem: Fluid Volume: Goal: Ability to  maintain a balanced intake and output will improve Outcome: Progressing   Problem: Health Behavior/Discharge Planning: Goal: Ability to identify and utilize available resources and services will improve Outcome: Progressing Goal: Ability to manage health-related needs will improve Outcome: Progressing   Problem: Metabolic: Goal: Ability to maintain appropriate glucose levels will improve Outcome: Progressing   Problem: Nutritional: Goal: Maintenance of adequate nutrition will improve Outcome: Progressing Goal: Progress toward achieving an optimal weight will improve Outcome: Progressing   Problem: Skin Integrity: Goal: Risk for impaired skin integrity will decrease Outcome: Progressing   Problem: Tissue Perfusion: Goal: Adequacy of tissue perfusion will improve Outcome: Progressing   Problem: Education: Goal: Knowledge of Childbirth will improve Outcome: Progressing Goal: Ability to make informed decisions regarding treatment and plan of care will improve Outcome: Progressing Goal: Ability to state and carry out methods to decrease the pain will improve Outcome: Progressing Goal: Individualized Educational Video(s) Outcome: Progressing   Problem: Coping: Goal: Ability to verbalize concerns and feelings about labor and delivery will improve Outcome: Progressing   Problem: Life Cycle: Goal: Ability to make normal progression through stages of labor will improve Outcome: Progressing Goal: Ability to effectively push during vaginal delivery will improve Outcome: Progressing   Problem: Role Relationship: Goal: Will demonstrate positive interactions with the child Outcome: Progressing   Problem: Safety: Goal: Risk of complications during labor and delivery will decrease Outcome: Progressing   Problem: Pain Management: Goal: Relief or control of pain from uterine contractions will improve Outcome: Progressing

## 2022-06-19 LAB — BASIC METABOLIC PANEL
Anion gap: 4 — ABNORMAL LOW (ref 5–15)
BUN: 12 mg/dL (ref 6–20)
CO2: 20 mmol/L — ABNORMAL LOW (ref 22–32)
Calcium: 8.2 mg/dL — ABNORMAL LOW (ref 8.9–10.3)
Chloride: 112 mmol/L — ABNORMAL HIGH (ref 98–111)
Creatinine, Ser: 0.66 mg/dL (ref 0.44–1.00)
GFR, Estimated: >60 mL/min (ref >60)
Glucose, Bld: 78 mg/dL (ref 70–99)
Potassium: 3.6 mmol/L (ref 3.5–5.1)
Sodium: 136 mmol/L (ref 135–145)

## 2022-06-19 LAB — RPR: RPR Ser Ql: NONREACTIVE

## 2022-06-19 LAB — GLUCOSE, CAPILLARY
Glucose-Capillary: 82 mg/dL (ref 70–99)
Glucose-Capillary: 78 mg/dL (ref 70–99)
Glucose-Capillary: 73 mg/dL (ref 70–99)
Glucose-Capillary: 70 mg/dL (ref 70–99)

## 2022-06-19 LAB — GLUCOSE, RANDOM: Glucose: 76 mg/dL (ref 70–99)

## 2022-06-19 MED ORDER — MEDROXYPROGESTERONE ACETATE 150 MG/ML IM SUSP
150.0000 mg | INTRAMUSCULAR | Status: DC
  Administered 2022-06-20: 150 mg via INTRAMUSCULAR
  Filled 2022-06-19: qty 1

## 2022-06-19 MED ORDER — AMMONIA AROMATIC IN INHA
RESPIRATORY_TRACT | Status: AC
  Filled 2022-06-19: qty 10

## 2022-06-19 MED ORDER — OXYTOCIN 10 UNIT/ML IJ SOLN
INTRAMUSCULAR | Status: AC
  Filled 2022-06-19: qty 2

## 2022-06-19 MED ORDER — IBUPROFEN 600 MG PO TABS
600.0000 mg | ORAL_TABLET | Freq: Four times a day (QID) | ORAL | Status: DC
  Administered 2022-06-19 – 2022-06-20 (×4): 600 mg via ORAL
  Filled 2022-06-19 (×4): qty 1

## 2022-06-19 MED ORDER — MISOPROSTOL 200 MCG PO TABS
ORAL_TABLET | ORAL | Status: AC
  Filled 2022-06-19: qty 2

## 2022-06-19 MED ORDER — DIPHENHYDRAMINE HCL 25 MG PO CAPS: 25.0000 mg | ORAL_CAPSULE | Freq: Four times a day (QID) | ORAL | Status: DC | PRN

## 2022-06-19 MED ORDER — TETANUS-DIPHTH-ACELL PERTUSSIS 5-2.5-18.5 LF-MCG/0.5 IM SUSY: 0.5000 mL | PREFILLED_SYRINGE | Freq: Once | INTRAMUSCULAR | Status: DC

## 2022-06-19 MED ORDER — OXYTOCIN-SODIUM CHLORIDE 30-0.9 UT/500ML-% IV SOLN
INTRAVENOUS | Status: AC
  Filled 2022-06-19: qty 500

## 2022-06-19 MED ORDER — DIBUCAINE (PERIANAL) 1 % EX OINT: 1.0000 | TOPICAL_OINTMENT | CUTANEOUS | Status: DC | PRN

## 2022-06-19 MED ORDER — PRENATAL MULTIVITAMIN CH
1.0000 | ORAL_TABLET | Freq: Every day | ORAL | Status: DC
  Administered 2022-06-19: 1 via ORAL
  Filled 2022-06-19: qty 1

## 2022-06-19 MED ORDER — ONDANSETRON HCL 4 MG/2ML IJ SOLN: 4.0000 mg | INTRAMUSCULAR | Status: DC | PRN

## 2022-06-19 MED ORDER — COCONUT OIL OIL: 1.0000 | TOPICAL_OIL | Status: DC | PRN

## 2022-06-19 MED ORDER — SIMETHICONE 80 MG PO CHEW: 80.0000 mg | CHEWABLE_TABLET | ORAL | Status: DC | PRN

## 2022-06-19 MED ORDER — BENZOCAINE-MENTHOL 20-0.5 % EX AERO: 1.0000 | INHALATION_SPRAY | CUTANEOUS | Status: DC | PRN

## 2022-06-19 MED ORDER — LIDOCAINE HCL (PF) 1 % IJ SOLN
INTRAMUSCULAR | Status: AC
  Filled 2022-06-19: qty 30

## 2022-06-19 MED ORDER — SENNOSIDES-DOCUSATE SODIUM 8.6-50 MG PO TABS
2.0000 | ORAL_TABLET | Freq: Every day | ORAL | Status: DC
  Administered 2022-06-20: 2 via ORAL
  Filled 2022-06-19: qty 2

## 2022-06-19 MED ORDER — WITCH HAZEL-GLYCERIN EX PADS: 1.0000 | MEDICATED_PAD | CUTANEOUS | Status: DC | PRN

## 2022-06-19 MED ORDER — MISOPROSTOL 200 MCG PO TABS
ORAL_TABLET | ORAL | Status: AC
  Filled 2022-06-19: qty 4

## 2022-06-19 MED ORDER — ONDANSETRON HCL 4 MG PO TABS: 4.0000 mg | ORAL_TABLET | ORAL | Status: DC | PRN

## 2022-06-19 MED ORDER — ACETAMINOPHEN 325 MG PO TABS
650.0000 mg | ORAL_TABLET | ORAL | Status: DC | PRN
  Administered 2022-06-19: 650 mg via ORAL
  Filled 2022-06-19: qty 2

## 2022-06-19 MED ORDER — ZOLPIDEM TARTRATE 5 MG PO TABS: 5.0000 mg | ORAL_TABLET | Freq: Every evening | ORAL | Status: DC | PRN

## 2022-06-19 NOTE — Discharge Summary (Signed)
Obstetrical Discharge Summary  Patient Name: Shirley Park DOB: 1986/05/04 MRN: [id number]  Date of Admission: 06/18/2022 Date of Delivery: 06/19/22 Delivered by: Lucrezia Europe, CNM Date of Discharge: 06/20/2022  Primary OB: [facility]  YIR:SWNIOEV'O last menstrual period was 10/03/2021 (approximate). EDC Estimated Date of Delivery: 07/07/22 Gestational Age at Delivery: [redacted]w[redacted]d  Antepartum complications:  1. GDM 2. Obesity 3. Illiterate 4. Depression 5. Cervical dysplasia 6. AMA 7. Anemia  Admitting Diagnosis: IOL for GDM, oligohydramnios Secondary Diagnosis: NSVD Patient Active Problem List   Diagnosis Date Noted   GDM (gestational diabetes mellitus) 06/18/2022   Depression affecting pregnancy 05/02/2022   Gestational diabetes 04/18/22 at 28 4/7 04/21/2022   Abnormal glucose affecting pregnancy 12/31/2021   Anemia affecting pregnancy in first trimester 12/30/2021   Supervision of high risk pregnancy, antepartum 12/30/2021   Missed abortion with fetal demise before 20 completed weeks of gestation 08/29/2021   Illiterate: can't read or write or sign her name 07/16/2021   Obesity affecting pregnancy BMI=35.5 07/16/2021   Advanced maternal age in multigravida 36yo 07/16/2021   Cervical dysplasia 12/10/2016   Augmentation: AROM, Pitocin, and Cytotec Complications: None Intrapartum complications/course: she was sent over from [facility] for IOL for GDM and oligohydramnios. She received two doses of cytotec then pitocin and AROM and quickly progressed to 10/100/+3 and pushed for 2 minutes, delivering viable female infant over intact perineum. Apgars 8/9. Date of Delivery: 06/19/22 Delivered By: DLucrezia Europe CNM Delivery Type: spontaneous vaginal delivery Anesthesia: none Placenta: spontaneous Laceration: none Episiotomy: none Newborn Data: Live born female  Birth Weight:  6#9 APGAR: 8, 9  Newborn Delivery   Birth date/time: 06/19/2022 12:12:00 Delivery type: Vaginal,  Spontaneous     Postpartum Procedures: none  Edinburgh:     06/20/2022    9:10 AM 02/14/2019    5:00 PM  Edinburgh Postnatal Depression Scale Screening Tool  I have been able to laugh and see the funny side of things. 0 0  I have looked forward with enjoyment to things. 0 0  I have blamed myself unnecessarily when things went wrong. 0 0  I have been anxious or worried for no good reason. 0 0  I have felt scared or panicky for no good reason. 1 0  Things have been getting on top of me. 0 0  I have been so unhappy that I have had difficulty sleeping. 0 0  I have felt sad or miserable. 0 0  I have been so unhappy that I have been crying. 0 0  The thought of harming myself has occurred to me. 0 0  Edinburgh Postnatal Depression Scale Total 1 0     Post partum course:  Patient had an uncomplicated postpartum course.  By time of discharge on PPD#1, her pain was controlled on oral pain medications; she had appropriate lochia and was ambulating, voiding without difficulty and tolerating regular diet.  She was deemed stable for discharge to home.    Discharge Physical Exam: 06/20/2022 at 0900 BP 92/65 (BP Location: Right Arm)   Pulse 63   Temp 98 F (36.7 C) (Oral)   Resp 20   Ht 5' 3" (1.6 m)   Wt 95.7 kg   LMP 10/03/2021 (Approximate)   SpO2 100%   Breastfeeding Unknown   BMI 37.38 kg/m   General: NAD CV: RRR Pulm: CTABL, nl effort ABD: s/nd/nt, fundus firm and below the umbilicus Lochia: small Perineum: well approximated/intact DVT Evaluation: LE non-ttp, no evidence of DVT on exam.  Hemoglobin  Date Value Ref Range Status  06/20/2022 11.6 (L) 12.0 - 15.0 g/dL Final  12/30/2021 9.2 (L) 11.1 - 15.9 g/dL Final   HCT  Date Value Ref Range Status  06/20/2022 37.2 36.0 - 46.0 % Final   Hematocrit  Date Value Ref Range Status  12/30/2021 31.9 (L) 34.0 - 46.6 % Final     Disposition: stable, discharge to home. Baby Feeding: breastmilk and formula Baby Disposition:  home with mom  Rh Immune globulin given: B pos Rubella vaccine given: immune Varicella vaccine given: immune Tdap vaccine given in AP or PP setting: not given Flu vaccine given in AP or PP setting: not given  Contraception: Depo  Prenatal Labs:  Blood type/Rh B pos  Antibody screen neg  Rubella Immune  Varicella Immune  RPR NR  HBsAg Neg  HIV NR  GC neg  Chlamydia neg  Genetic screening declined  1 hour GTT    3 hour GTT 101, 203, 183, 135  GBS neg     Plan:  Shirley Park was discharged to home in good condition. Follow-up appointment with delivering provider in 6 weeks.  Discharge Medications: Allergies as of 06/20/2022   No Known Allergies      Medication List     TAKE these medications    acetaminophen 325 MG tablet Commonly known as: Tylenol Take 2 tablets (650 mg total) by mouth every 4 (four) hours as needed (for pain scale < 4).   benzocaine-Menthol 20-0.5 % Aero Commonly known as: DERMOPLAST Apply 1 Application topically as needed for irritation (perineal discomfort).   blood glucose meter kit and supplies Kit Dispense based on patient and insurance preference. Use up to four times daily as directed.   BLOOD GLUCOSE TEST STRIPS Strp 1 Units by In Vitro route in the morning, at noon, in the evening, and at bedtime.   coconut oil Oil Apply 1 Application topically as needed.   diphenhydrAMINE 25 mg capsule Commonly known as: BENADRYL Take 1 capsule (25 mg total) by mouth every 6 (six) hours as needed for itching.   ibuprofen 600 MG tablet Commonly known as: ADVIL Take 1 tablet (600 mg total) by mouth every 6 (six) hours.   Iron (Ferrous Sulfate) 325 (65 Fe) MG Tabs Take 1 tablet by mouth in the morning, at noon, and at bedtime.   Lancets Misc 1 Units by Does not apply route in the morning, at noon, in the evening, and at bedtime. Dispense insurance approves, Medicaid approved or least expensive lancets   PRENATAL GUMMIES PO Take  by mouth.   senna-docusate 8.6-50 MG tablet Commonly known as: Senokot-S Take 2 tablets by mouth daily. Start taking on: June 21, 2022   simethicone 80 MG chewable tablet Commonly known as: MYLICON Chew 1 tablet (80 mg total) by mouth as needed for flatulence.   witch hazel-glycerin pad Commonly known as: TUCKS Apply 1 Application topically as needed for hemorrhoids.         Follow-up [place] DEPT Follow up in 6 week(s).   Why: 6wk postpartum, desires Depo Contact information: [address] [place] [address] [phone]                Signed:  Francetta Found, CNM 06/20/2022 11:54 AM

## 2022-06-19 NOTE — Progress Notes (Signed)
Labor Progress Note  Shirley Park is a 36 y.o. [phone] at [redacted]w[redacted]d by 34 week ultrasound admitted for induction of labor due to Gestational diabetes with unknown blood sugar control.  Subjective: She is now feeling her contractions, rating them 7/10.  Objective: BP 101/65 (BP Location: Right Arm)   Pulse 72   Temp 98 F (36.7 C) (Oral)   Resp 20   Ht 5\' 3"  (1.6 m)   Wt 95.7 kg   LMP 10/03/2021 (Approximate)   SpO2 100%   BMI 37.38 kg/m  Notable VS details: reviewed  Fetal Assessment: FHT:  FHR: 135 bpm, variability: moderate,  accelerations:  Present,  decelerations:  Absent Category/reactivity:  Category I UC:   regular, every 2-4 minutes SVE:    Dilation: 3cm  Effacement: 70%  Station:  -3  Consistency: soft  Position: middle  Membrane status:intact Amniotic color: n/a  Labs: Lab Results  Component Value Date   WBC 11.1 (H) 06/18/2022   HGB 12.1 06/18/2022   HCT 37.8 06/18/2022   MCV 79.1 (L) 06/18/2022   PLT 338 06/18/2022    Assessment / Plan: Induction of labor at [redacted]w[redacted]d for GDM with unknown blood sugar control, received two doses cytotec, now on pitocin  Labor:  Received 2 doses vaginal/oral cytotec, one at 2043 and one at 0423. Now starting pitocin  , currently at 32mu/min Preeclampsia:  no signs or symptoms of toxicity Fetal Wellbeing:  Category I GDM: Blood sugars 73-82, now checking blood sugars q2hrs, will implement endotool if blood sugars >120. Diabetes coordinator note in the chart. Pain Control:  Labor support without medications I/D:   GBS negative Anticipated MOD:  NSVD  Gertie Fey, CNM 06/19/2022, 8:58 AM

## 2022-06-19 NOTE — Inpatient Diabetes Management (Signed)
Inpatient Diabetes Program Recommendations  AACE/ADA: New Consensus Statement on Inpatient Glycemic Control   Target Ranges:  Prepandial:   less than 140 mg/dL      Peak postprandial:   less than 180 mg/dL (1-2 hours)      Critically ill patients:  140 - 180 mg/dL    Latest Reference Range & Units 06/18/22 13:24 06/19/22 01:05 06/19/22 04:36  Glucose-Capillary 70 - 99 mg/dL 77 82 78    Latest Reference Range & Units 07/16/21 13:53 12/30/21 10:40  Hemoglobin A1C 4.8 - 5.6 % 5.5 5.5   Review of Glycemic Control  Diabetes history: GDM; [redacted]W[redacted]D Outpatient Diabetes medications: None; diet controlled Current orders for Inpatient glycemic control: IV insulin  NOTE: Noted consult for diabetes coordinator. Per chart review, patient has history of GDM and per office note on 06/18/22 by Donnal Moat, CNM, patient is not literate enough to be checking blood sugars accurately; patient was advised to go to L&D for u/s and IOL. Glucose since arrival to hospital has ranged from 77-82 mg/dl. IV insulin is ordered but has not been started since it has not been needed. Anticipate glucose will return to baseline following delivery and would recommend checking fasting glucose after delivery.  Thanks, Barnie Alderman, RN, MSN, [place] Diabetes Coordinator Inpatient Diabetes Program [phone] (Team Pager from 8am to [place])

## 2022-06-20 LAB — CBC
HCT: 37.2 % (ref 36.0–46.0)
Hemoglobin: 11.6 g/dL — ABNORMAL LOW (ref 12.0–15.0)
MCH: 24.8 pg — ABNORMAL LOW (ref 26.0–34.0)
MCHC: 31.2 g/dL (ref 30.0–36.0)
MCV: 79.5 fL — ABNORMAL LOW (ref 80.0–100.0)
Platelets: 244 10*3/uL (ref 150–400)
RBC: 4.68 MIL/uL (ref 3.87–5.11)
RDW: 16.3 % — ABNORMAL HIGH (ref 11.5–15.5)
WBC: 10.4 10*3/uL (ref 4.0–10.5)
nRBC: 0 % (ref 0.0–0.2)

## 2022-06-20 LAB — GLUCOSE, CAPILLARY: Glucose-Capillary: 78 mg/dL (ref 70–99)

## 2022-06-20 LAB — HEMOGLOBIN A1C
Hgb A1c MFr Bld: 5.5 % (ref 4.8–5.6)
Mean Plasma Glucose: 111.15 mg/dL

## 2022-06-20 MED ORDER — SIMETHICONE 80 MG PO CHEW: 80.0000 mg | CHEWABLE_TABLET | ORAL | 0 refills | Status: DC | PRN

## 2022-06-20 MED ORDER — DIPHENHYDRAMINE HCL 25 MG PO CAPS: 25.0000 mg | ORAL_CAPSULE | Freq: Four times a day (QID) | ORAL | 0 refills | Status: DC | PRN

## 2022-06-20 MED ORDER — SENNOSIDES-DOCUSATE SODIUM 8.6-50 MG PO TABS: 2.0000 | ORAL_TABLET | Freq: Every day | ORAL | 0 refills | Status: DC

## 2022-06-20 MED ORDER — ACETAMINOPHEN 325 MG PO TABS: 650.0000 mg | ORAL_TABLET | ORAL | Status: DC | PRN

## 2022-06-20 MED ORDER — IBUPROFEN 600 MG PO TABS: 600.0000 mg | ORAL_TABLET | Freq: Four times a day (QID) | ORAL | 0 refills | Status: DC

## 2022-06-20 MED ORDER — BENZOCAINE-MENTHOL 20-0.5 % EX AERO: 1.0000 | INHALATION_SPRAY | CUTANEOUS | Status: DC | PRN

## 2022-06-20 MED ORDER — COCONUT OIL OIL: 1.0000 | TOPICAL_OIL | 0 refills | Status: DC | PRN

## 2022-06-20 MED ORDER — WITCH HAZEL-GLYCERIN EX PADS: 1.0000 | MEDICATED_PAD | CUTANEOUS | 12 refills | Status: DC | PRN

## 2022-06-20 NOTE — Progress Notes (Signed)
Patient discharged. Discharge instructions given. Patient verbalizes understanding. Transported by axillary. 

## 2022-06-20 NOTE — TOC Initial Note (Signed)
Transition of Care [facility]) - Initial/Assessment Note    Patient Details  Name: Shirley Park MRN: [id number] Date of Birth: 1986/01/17  [facility] of [facility] CM/SW Contact:    Alberteen Sam, LCSW Phone Number: 06/20/2022, 11:15 AM  Clinical Narrative:                  CSW spoke with patient and husband at bedside, they also had present their almost 37 year old son. Although patient speaks spanish, husband speaks [place]. He reports he will drive home at dc, they have car seat, diapers and everything they need. This is their second child, introduced this CSW to new baby [name]. Reports no needs at this time. TOC signing off.    Barriers to Discharge: No Barriers Identified   Patient Goals and CMS Choice Patient states their goals for this hospitalization and ongoing recovery are:: to go home CMS Medicare.gov Compare Post Acute Care list provided to:: Patient Choice offered to / list presented to : Patient  Expected Discharge Plan and Services         Living arrangements for the past 2 months: Single Family Home                                      Prior Living Arrangements/Services Living arrangements for the past 2 months: Single Family Home Lives with:: Spouse                   Activities of Daily Living Home Assistive Devices/Equipment: None ADL Screening (condition at time of admission) Patient's cognitive ability adequate to safely complete daily activities?: Yes Is the patient deaf or have difficulty hearing?: No Does the patient have difficulty seeing, even when wearing glasses/contacts?: No Does the patient have difficulty concentrating, remembering, or making decisions?: No Patient able to express need for assistance with ADLs?: No Does the patient have difficulty dressing or bathing?: No Independently performs ADLs?: Yes (appropriate for developmental age) Does the patient have difficulty walking or climbing stairs?: No Weakness of  Legs: None Weakness of Arms/Hands: None  Permission Sought/Granted                  Emotional Assessment       Orientation: : Oriented to Self, Oriented to Place, Oriented to  Time, Oriented to Situation Alcohol / Substance Use: Not Applicable Psych Involvement: No (comment)  Admission diagnosis:  GDM (gestational diabetes mellitus) [O24.419] Patient Active Problem List   Diagnosis Date Noted   GDM (gestational diabetes mellitus) 06/18/2022   Depression affecting pregnancy 05/02/2022   Gestational diabetes 04/18/22 at 28 4/7 04/21/2022   Abnormal glucose affecting pregnancy 12/31/2021   Anemia affecting pregnancy in first trimester 12/30/2021   Supervision of high risk pregnancy, antepartum 12/30/2021   Missed abortion with fetal demise before 20 completed weeks of gestation 08/29/2021   Illiterate: can't read or write or sign her name 07/16/2021   Obesity affecting pregnancy BMI=35.5 07/16/2021   Advanced maternal age in multigravida 36 yo 07/16/2021   Cervical dysplasia 12/10/2016   PCP:  Merryl Hacker No Pharmacy:   [facility] [address] ([address]), [place] [address] [place]) [place] [address] Phone: [phone] Fax: [phone]     Social Determinants of Health (SDOH) Interventions    Readmission Risk Interventions     No data to display

## 2022-06-24 ENCOUNTER — Ambulatory Visit: Payer: Self-pay

## 2022-08-05 ENCOUNTER — Telehealth: Payer: Self-pay

## 2022-08-05 NOTE — Telephone Encounter (Signed)
Call to patient to verify PP appointment for 08/07/22 at 0930 arrival time. Informed patient that she will need a 2 hr gtt test since she had diabetes during pregnancy.   Patient states she is unsure if she can make it to appointment due to transportation and child care but she will try to make it. Informed patient if she can come to come in fasting past midnight Wednesday.   Earlyne Iba, RN

## 2022-08-07 ENCOUNTER — Encounter: Payer: Self-pay | Admitting: Family Medicine

## 2022-08-07 ENCOUNTER — Encounter: Payer: HRSA Program | Admitting: Family Medicine

## 2022-08-07 ENCOUNTER — Telehealth: Payer: Self-pay

## 2022-08-07 DIAGNOSIS — Z8632 Personal history of gestational diabetes: Secondary | ICD-10-CM | POA: Insufficient documentation

## 2022-08-07 NOTE — Telephone Encounter (Signed)
Call to patient regarding missed postpartum appointment 08/07/22. Patient did not answer and VM not set up.   (When rescheduling please be aware patient needs 2 hr gtt and will need instructions and an early appointment).   Earlyne Iba, RN

## 2022-08-07 NOTE — Progress Notes (Signed)
Erroneous encounter-disregard

## 2022-08-08 NOTE — Telephone Encounter (Signed)
Call to patient regarding missed postpartum appointment 08/07/22. Patient did not answer and VM not set up.    (When rescheduling please be aware patient needs 2 hr gtt and will need instructions and an early appointment).   Call to emergency contact, Dennison Nancy, RN

## 2022-08-08 NOTE — Telephone Encounter (Signed)
Call to emergency contact Jari Favre, and scheduled patient for PP plus BCM visit for 09/03/22 for 10:30 arrival time.   Earlyne Iba, RN

## 2022-09-02 ENCOUNTER — Ambulatory Visit: Payer: Self-pay

## 2022-09-03 ENCOUNTER — Ambulatory Visit: Payer: Self-pay

## 2022-09-03 ENCOUNTER — Encounter: Payer: Self-pay | Admitting: Advanced Practice Midwife

## 2022-09-03 ENCOUNTER — Ambulatory Visit (LOCAL_COMMUNITY_HEALTH_CENTER): Payer: Self-pay | Admitting: Advanced Practice Midwife

## 2022-09-03 DIAGNOSIS — Z30013 Encounter for initial prescription of injectable contraceptive: Secondary | ICD-10-CM

## 2022-09-03 DIAGNOSIS — Z3202 Encounter for pregnancy test, result negative: Secondary | ICD-10-CM

## 2022-09-03 DIAGNOSIS — Z309 Encounter for contraceptive management, unspecified: Secondary | ICD-10-CM

## 2022-09-03 LAB — HEMOGLOBIN, FINGERSTICK: Hemoglobin: 13.2 g/dL (ref 11.1–15.9)

## 2022-09-03 LAB — PREGNANCY, URINE: Preg Test, Ur: NEGATIVE

## 2022-09-03 MED ORDER — MEDROXYPROGESTERONE ACETATE 150 MG/ML IM SUSP
150.0000 mg | INTRAMUSCULAR | Status: AC
  Administered 2022-09-03 – 2023-04-27 (×4): 150 mg via INTRAMUSCULAR

## 2022-09-03 NOTE — Progress Notes (Signed)
[place]  Postpartum Exam  Shirley Park is a 37 y.o. SHF nonsmoker [id number] (41, 64, 3, infant) female who presents for a postpartum visit. IOL at 40 wks for uncontrolled GDM, oligo, EFW  with cytotec x2, AROM, pit. She is 10 weeks postpartum following a normal spontaneous vaginal delivery on 06/19/22 M 6#9.  I have fully reviewed the prenatal and intrapartum course. The delivery was at 37 3/7 gestational weeks.  Anesthesia: none. Postpartum course has been wnl. Baby is doing well. Baby is feeding by both breast and bottle - [employer] Good Start. Bleeding thin lochia. Bowel function is normal. Bladder function is normal. Patient is sexually active. Contraception method is Depo-Provera injections. Postpartum depression screening: negative. Not working. Nursing q 1-2 hours followed by formula (4 oz). States baby weighed 11 lbs at peds apt. Last pap 05/11/2020 neg. Living with FOB and 4 kids   The pregnancy intention screening data noted above was reviewed. Potential methods of contraception were discussed. The patient elected to proceed with No data recorded.    Health Maintenance Due  Topic Date Due   INFLUENZA VACCINE  Never done   COVID-19 Vaccine (3 - 2023-24 season) 05/02/2022    The following portions of the patient's history were reviewed and updated as appropriate: allergies, current medications, past family history, past medical history, past social history, past surgical history, and problem list.  Review of Systems Pertinent items are noted in HPI.  Objective:  BP 112/79   Pulse 81   Temp (!) 97.4 F (36.3 C)   Wt 196 lb 9.6 oz (89.2 kg)   Breastfeeding Yes   BMI 34.83 kg/m    General:  alert   Breasts:  not indicated  Lungs: clear to auscultation bilaterally  Heart:  regular rate and rhythm, S1, S2 normal, no murmur, click, rub or gallop  Abdomen: soft, non-tender; bowel sounds normal; no masses,  no organomegaly   Wound N/a  GU exam:   normal       Assessment:    1. Postpartum exam Pt states she received first DMPA pp in hospital on 06/20/22 but that is not recorded in hospital note If PT neg today may receive DMPA 150 mg IM q 11-13 wks x 1 year Please give pt dental list and encourage exam Please send pt to [facility] to talk to breastfeeding peer counselor today Please schedule 2 hour GTT asap Needs pap 05/2023   10 wks postpartum exam.   Plan:   Essential components of care per ACOG recommendations:  1.  Mood and well being: Patient with negative depression screening today. Reviewed local resources for support.  - Patient tobacco use? No.   - hx of drug use? No.    2. Infant care and feeding:  -Patient currently breastmilk feeding? Yes. Reviewed importance of draining breast regularly to support lactation.  -Social determinants of health (SDOH) reviewed in EPIC. No concernsThe following needs were identified  3. Sexuality, contraception and birth spacing - Patient does not want a pregnancy in the next year.  Desired family size is 4 children.  - Reviewed reproductive life planning. Reviewed options based on patient desire and reproductive life plan. Patient is interested in Hormonal Injection. This was provided to the patient today.  if not why not clearly documented  Risks, benefits, and typical effectiveness rates were reviewed.  Questions were answered.  Written information was also given to the patient to review.    The patient will follow up in  11-13 weeks for surveillance.  The patient was told to call with any further questions, or with any concerns about this method of contraception.  Emphasized use of condoms 100% of the time for STI prevention.  Patient was offered ECP based on not meeting criteria.  Patient is within 15 days of unprotected sex. Patient was offered ECP. Reviewed options and patient desired No method of ECP, declined all    - Discussed birth spacing of 18 months  4. Sleep and  fatigue -Encouraged family/partner/community support of 4 hrs of uninterrupted sleep to help with mood and fatigue  5. Physical Recovery  - Discussed patients delivery and complications. She describes her labor as good. - Patient had a Vaginal, no problems at delivery. Patient had a  0  laceration. Perineal healing reviewed. Patient expressed understanding - Patient has urinary incontinence? No. - Patient is safe to resume physical and sexual activity  6.  Health Maintenance - HM due items addressed Yes - Last pap smear No results found for: "DIAGPAP" Pap smear not done at today's visit.  -Breast Cancer screening indicated? No.   7. Chronic Disease/Pregnancy Condition follow up: Gestational Diabetes  - PCP follow up  Herbie Saxon, [place] for [place]

## 2022-09-08 ENCOUNTER — Other Ambulatory Visit: Payer: Self-pay

## 2022-09-08 DIAGNOSIS — O99810 Abnormal glucose complicating pregnancy: Secondary | ICD-10-CM

## 2022-09-08 NOTE — Progress Notes (Signed)
In  nurse clinic for 2 hr GTT. [place] interpreter.  Reports npo since 6 pm yesterday. Instructions reviewed for test today. Voices understanding.  Josie Saunders, RN

## 2022-09-09 ENCOUNTER — Telehealth: Payer: Self-pay

## 2022-09-09 LAB — GLUCOSE TOLERANCE, 2 HOURS
Glucose, 2 hour: 98 mg/dL (ref 70–139)
Glucose, GTT - Fasting: 83 mg/dL (ref 70–99)

## 2022-09-09 NOTE — Telephone Encounter (Signed)
Call to client with [facility] [employer]pre[employer] ID # [phone] to notify her of 2 hour GTT results and provider recommendation for PCP evaluation ASAP to ascertain if has diabetes. Client states she does not have pen and paper at home so unable to give her 2 hour GTT results. Counseled to notify PCP that had GDM and 2 hour GTT after delivery values were abnormally high. Verified with client that had all the papers she received in her bag at recent post-partum appt. Counseled on importance of visit as untreated DM could have serious adverse effects on her health. Questions answered. Rich Number, RN

## 2022-11-24 ENCOUNTER — Ambulatory Visit: Payer: Self-pay

## 2022-11-24 ENCOUNTER — Ambulatory Visit (LOCAL_COMMUNITY_HEALTH_CENTER): Payer: Self-pay

## 2022-11-24 VITALS — BP 118/71 | Ht 63.0 in | Wt 211.5 lb

## 2022-11-24 DIAGNOSIS — Z3009 Encounter for other general counseling and advice on contraception: Secondary | ICD-10-CM

## 2022-11-24 DIAGNOSIS — Z3042 Encounter for surveillance of injectable contraceptive: Secondary | ICD-10-CM

## 2022-11-24 NOTE — Progress Notes (Signed)
11 weeks 5 days post hormonal injection.  Patient reported no concerns or issues.  Medroxyprogesterone Acetate 150 mg. Given as ordered by Shirley Park, CNM on 09/03/2022 x 1 year.  Given IM Left Deltoid. Tolerated well. Reminder card provided to call for next appointment for hormonal injection due 02/09/2023. Shirley Park [place] provided interpretation.   Patient has not seen a PCP for evaluation for diabetes. Stated she has been testing blood glucose in the morning and it has been in the 80s. Explained she still needed evaluation. Provided a list of providers in the area and starred practices that accept clients with low or no income. Patient said she would see a medical provider.

## 2023-02-09 ENCOUNTER — Ambulatory Visit: Payer: Self-pay

## 2023-02-09 ENCOUNTER — Ambulatory Visit (LOCAL_COMMUNITY_HEALTH_CENTER): Payer: Self-pay

## 2023-02-09 VITALS — BP 119/87 | Ht 63.0 in | Wt 208.5 lb

## 2023-02-09 DIAGNOSIS — Z3009 Encounter for other general counseling and advice on contraception: Secondary | ICD-10-CM

## 2023-02-09 DIAGNOSIS — Z3042 Encounter for surveillance of injectable contraceptive: Secondary | ICD-10-CM

## 2023-02-09 NOTE — Progress Notes (Signed)
11 weeks 0 days post depo. Voices no concerns. Depo given today per order by Hazle Coca, CNM dated 09/03/22. Tolerated well in right deltoid.   Patient has still not been evaluated for diabetes, she states that she has been checking her blood sugar at home and it has been within normal limits. RN still encouraged patient to establish care with a PCP.  Next depo due 04/27/23, patient given reminder card.   Language line, Jesus [phone], used.   Abagail Kitchens, RN

## 2023-02-10 ENCOUNTER — Ambulatory Visit: Payer: Self-pay

## 2023-04-27 ENCOUNTER — Ambulatory Visit: Payer: Self-pay

## 2023-04-27 ENCOUNTER — Ambulatory Visit (LOCAL_COMMUNITY_HEALTH_CENTER): Payer: Self-pay

## 2023-04-27 VITALS — BP 117/69 | Ht 63.0 in | Wt 216.0 lb

## 2023-04-27 DIAGNOSIS — Z3009 Encounter for other general counseling and advice on contraception: Secondary | ICD-10-CM

## 2023-04-27 DIAGNOSIS — Z3042 Encounter for surveillance of injectable contraceptive: Secondary | ICD-10-CM

## 2023-04-27 NOTE — Progress Notes (Signed)
11 weeks 0 days post depo. Voices no concerns. Depo given today per order by Hazle Coca, CNM dated 09/03/2022.  Tolerated well L delt. Next depo due 07/13/2023. Has reminder. Jerel Shepherd, RN

## 2023-07-20 ENCOUNTER — Ambulatory Visit (LOCAL_COMMUNITY_HEALTH_CENTER): Payer: Self-pay

## 2023-07-20 VITALS — BP 126/77 | Ht 63.0 in | Wt 211.5 lb

## 2023-07-20 DIAGNOSIS — Z3042 Encounter for surveillance of injectable contraceptive: Secondary | ICD-10-CM

## 2023-07-20 DIAGNOSIS — Z3009 Encounter for other general counseling and advice on contraception: Secondary | ICD-10-CM

## 2023-07-20 MED ORDER — MEDROXYPROGESTERONE ACETATE 150 MG/ML IM SUSP
150.0000 mg | Freq: Once | INTRAMUSCULAR | Status: AC
  Administered 2023-07-20: 150 mg via INTRAMUSCULAR

## 2023-07-20 NOTE — Addendum Note (Signed)
Addended by: Regenia Skeeter on: 07/20/2023 03:41 PM   Modules accepted: Level of Service

## 2023-07-20 NOTE — Progress Notes (Addendum)
12 weeks post hormonal injection.  Patient reported no complaints or concerns.  Medroxyprogesterone Acetate 150 mg given per standing order by C. Larita Fife, MD. Given IM rt. deltoid. Tolerated well. Reminder card provided to call for next appointment for injection due 10/05/2023 and PE also due. Dicussed both could be scheduled at the same time.

## 2023-10-05 ENCOUNTER — Telehealth: Payer: Self-pay

## 2023-10-05 ENCOUNTER — Ambulatory Visit: Payer: Self-pay | Admitting: Family Medicine

## 2023-10-05 ENCOUNTER — Ambulatory Visit: Payer: Self-pay

## 2023-10-05 VITALS — BP 113/79 | HR 85 | Ht 63.0 in | Wt 209.2 lb

## 2023-10-05 DIAGNOSIS — N879 Dysplasia of cervix uteri, unspecified: Secondary | ICD-10-CM

## 2023-10-05 DIAGNOSIS — Z3009 Encounter for other general counseling and advice on contraception: Secondary | ICD-10-CM

## 2023-10-05 DIAGNOSIS — Z30013 Encounter for initial prescription of injectable contraceptive: Secondary | ICD-10-CM

## 2023-10-05 DIAGNOSIS — Z0000 Encounter for general adult medical examination without abnormal findings: Secondary | ICD-10-CM

## 2023-10-05 MED ORDER — MEDROXYPROGESTERONE ACETATE 150 MG/ML IM SUSP
150.0000 mg | INTRAMUSCULAR | Status: AC
  Administered 2023-10-05 – 2023-12-21 (×2): 150 mg via INTRAMUSCULAR

## 2023-10-05 NOTE — Progress Notes (Signed)
[facility] HEALTH DEPARTMENT [facility] [address] [place] [address] Main phone: [phone]  Family Planning Visit - Repeat Yearly Visit  Subjective:  Shirley Park is a 38 y.o. [id number]  being seen today for an annual wellness visit and to discuss contraception options. The patient is currently using hormonal injection for pregnancy prevention. Patient does not want a pregnancy in the next year.   Patient reports they are looking for a method with the following characteristics:  High efficacy at preventing pregnancy  Patient has the following medical problems:  Patient Active Problem List   Diagnosis Date Noted   History of gestational diabetes mellitus 08/07/2022   Depression 05/02/2022   Illiterate: can't read or write or sign her name 07/16/2021   Obesity (BMI 30-39.9) 03/28/2019   Cervical dysplasia 12/10/2016    Chief Complaint  Patient presents with   Annual Exam    Physical and Depo Provera injection    HPI Patient reports to clinic for PE and depo  Patient denies concerns about self   Review of Systems  Constitutional:  Negative for weight loss.  Eyes:  Negative for blurred vision.  Respiratory:  Negative for cough and shortness of breath.   Cardiovascular:  Negative for claudication.  Gastrointestinal:  Negative for nausea.  Genitourinary:  Negative for dysuria and frequency.  Skin:  Negative for rash.  Neurological:  Negative for headaches.  Endo/Heme/Allergies:  Does not bruise/bleed easily.    See flowsheet for other program required questions.   Diabetes screening This patient is 37 y.o. with a BMI of Body mass index is 37.06 kg/m.Marland Kitchen  Is patient eligible for diabetes screening (age >35 and BMI >25)?  Yes  Was Hgb A1c ordered? No- practitioner oversight.   STI screening Patient reports 1 of partners in last year.  Does this patient desire STI screening?  No - declined  Hepatitis C  screening Has patient been screened once for HCV in the past?  Yes  No results found for: "HCVAB"  Does the patient meet criteria for HCV testing? No  (If yes-- Screen for HCV through [facility]) Criteria:  Since the last HCV result, does the patient have any of the following? - Current drug use - Have a partner with drug use - Has been incarcerated  Hepatitis B screening Does the patient meet criteria for HBV testing? No Criteria:  -Household, sexual or needle sharing contact with HBV -History of drug use -HIV positive -Those with known Hep C  Cervical Cancer Screening  Result Date Procedure Results Follow-ups  05/11/2020 IGP, Aptima HPV DIAGNOSIS:: Comment Specimen adequacy:: Comment Clinician Provided ICD10: Comment Performed by:: Comment PAP Smear Comment: . Note:: Comment Test Methodology: Comment HPV Aptima: Negative     Health Maintenance Due  Topic Date Due   INFLUENZA VACCINE  Never done   COVID-19 Vaccine (3 - 2024-25 season) 05/03/2023    The following portions of the patient's history were reviewed and updated as appropriate: allergies, current medications, past family history, past medical history, past social history, past surgical history and problem list. Problem list updated.  Objective:   Vitals:   10/05/23 0953  BP: 113/79  Pulse: 85  Weight: 209 lb 3.2 oz (94.9 kg)  Height: 5\' 3"  (1.6 m)    Physical Exam Exam conducted with a chaperone present Shirley Park).  Constitutional:      Appearance: Normal appearance.  HENT:     Head: Normocephalic and atraumatic.  Pulmonary:  Effort: Pulmonary effort is normal.  Chest:  Breasts:    Tanner Score is 5.     Right: Normal. No mass, nipple discharge or skin change.     Left: Normal. No mass, nipple discharge or skin change.  Abdominal:     Palpations: Abdomen is soft.  Musculoskeletal:        General: Normal range of motion.  Skin:    General: Skin is warm and dry.  Neurological:      General: No focal deficit present.     Mental Status: She is alert.  Psychiatric:        Mood and Affect: Mood normal.        Behavior: Behavior normal.     Assessment and Plan:  Shirley Park is a 38 y.o. female [phone] presenting to the [facility] Department for an yearly wellness and contraception visit  1. Family planning Contraception counseling: Reviewed options based on patient desire and reproductive life plan. Patient is interested in Hormonal Injection. This was provided to the patient today.  Risks, benefits, and typical effectiveness rates were reviewed.  Questions were answered.  Written information was also given to the patient to review.    The patient will follow up in  3 months for surveillance.  The patient was told to call with any further questions, or with any concerns about this method of contraception.  Emphasized use of condoms 100% of the time for STI prevention.  Educated on ECP and assessed need for ECP.  Not indicated- covered under depo window  - medroxyPROGESTERone (DEPO-PROVERA) injection 150 mg  2. Well woman exam (no gynecological exam) (Primary) -CBE today (normal), next due in 1 year- reviewed recommendations for starting mammograms at age 38 -no concerns about self   3. Cervical dysplasia Next pap was due 05/2023- practitioner oversight- EPIC had marked Pap due in 05/2025- Patient called using [facility] interpreters [place] [phone]. No answer- VM left and counseled to call the HD back   Return in about 3 months (around 01/02/2024) for depo injection.  No future appointments. Due to language barrier, a Spanish interpreter Shirley Bo T.) was present in person during the history-taking, subsequent discussion, and physical exam with this patient.     Lenice Llamas, [place]

## 2023-10-05 NOTE — Progress Notes (Signed)
Pt here for annual physical and Depo Provera injection.  Declines STI screening.  Depo Provera 150mg  IM given in L deltoid without complications.  Condoms declined.  Family planning packet given.  Reminder card for next injection provided.-Collins Scotland, RN

## 2023-10-06 NOTE — Telephone Encounter (Signed)
No additional notes Jonathon Bellows, RN

## 2023-12-12 IMAGING — US US OB < 14 WEEKS - US OB TV
1 series · 14 of 28 positions shown · non-contrast
Comparison: 08/20/2021 ultrasound

CLINICAL DATA: Worsening vaginal bleeding

EXAM:
OBSTETRIC <14 WK US AND TRANSVAGINAL OB US
TECHNIQUE: Both transabdominal and transvaginal ultrasound examinations were
performed for complete evaluation of the gestation as well as the
maternal uterus, adnexal regions, and pelvic cul-de-sac.
Transvaginal technique was performed to assess early pregnancy.

[Series 1: us ob less than 14 weeks with ob transvaginal · 14 of 109 slices shown]
[im 5/109]
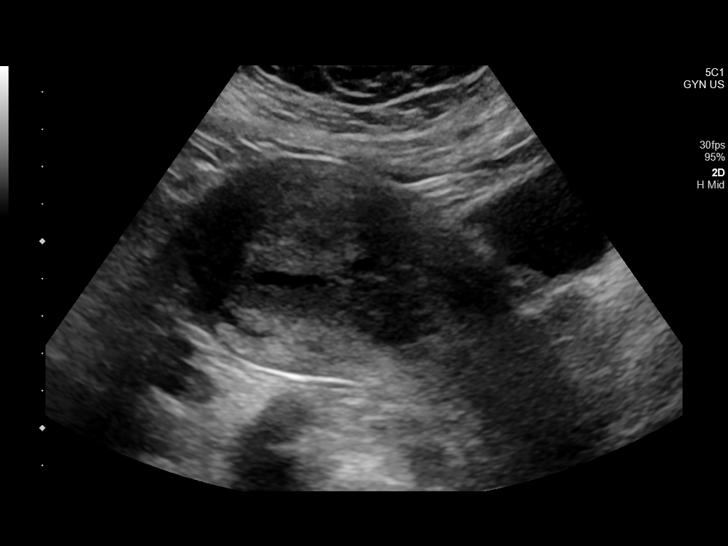
[im 13/109]
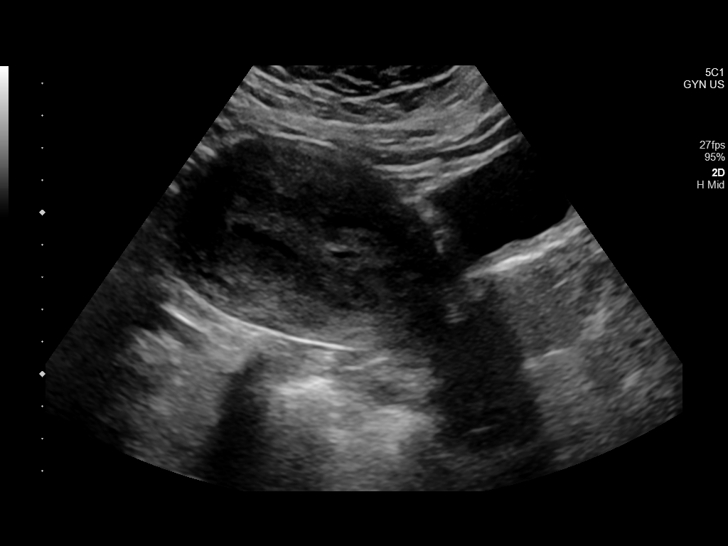
[im 21/109]
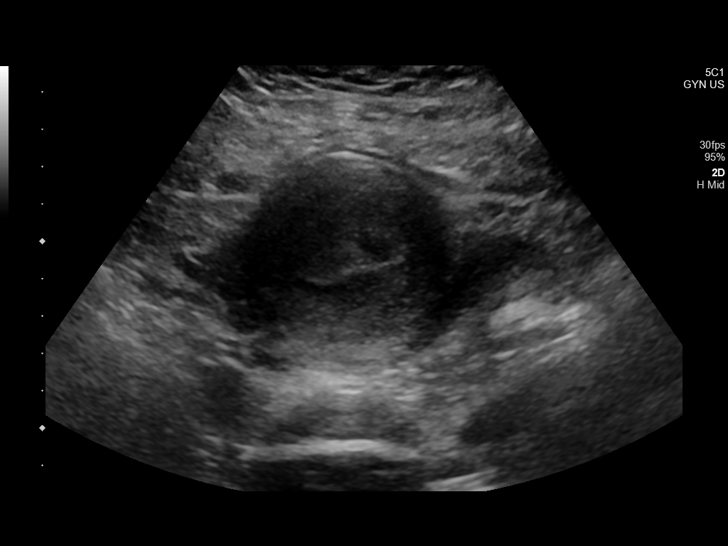
[im 29/109]
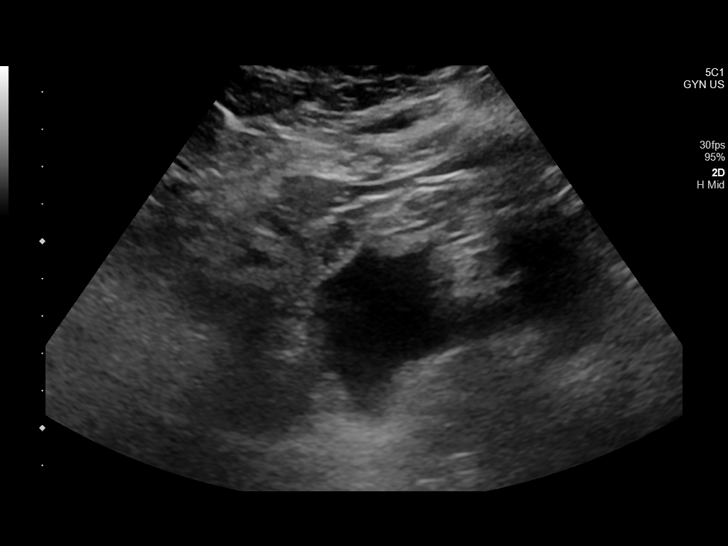
[im 37/109]
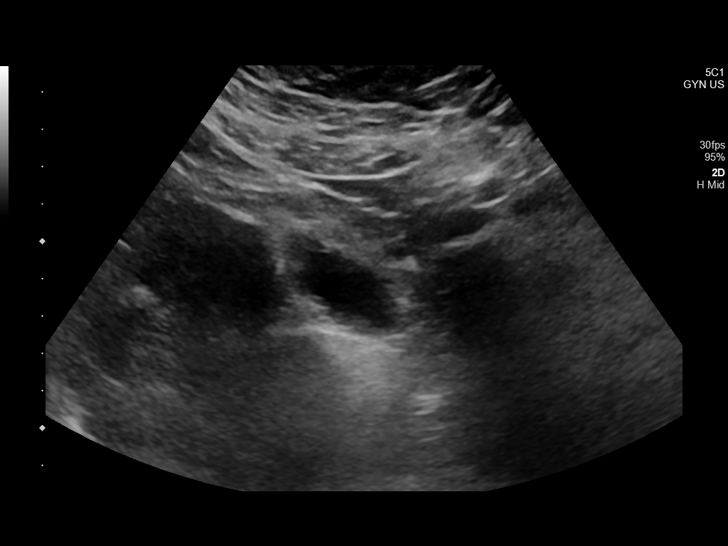
[im 45/109]
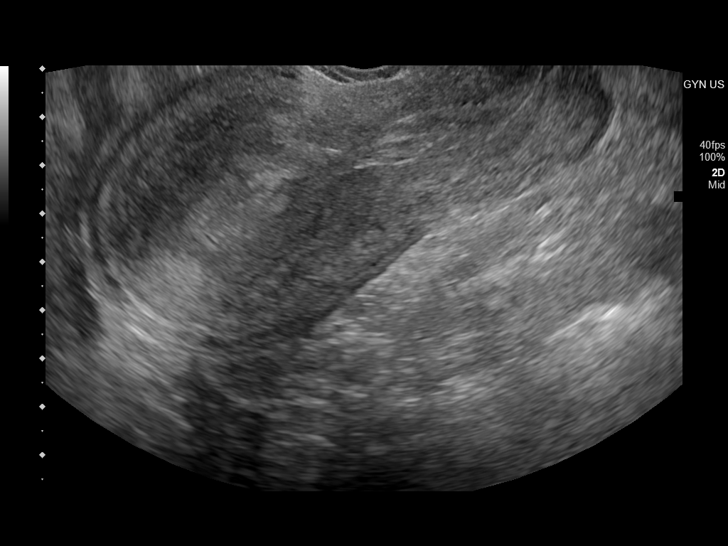
[im 53/109]
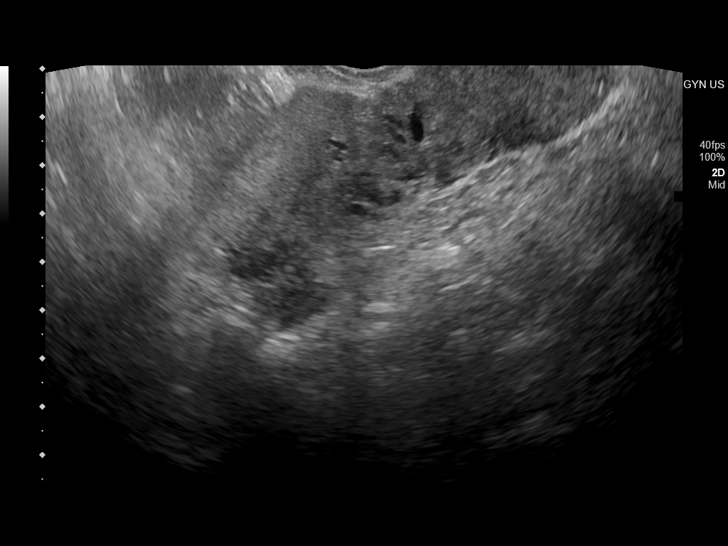
[im 61/109]
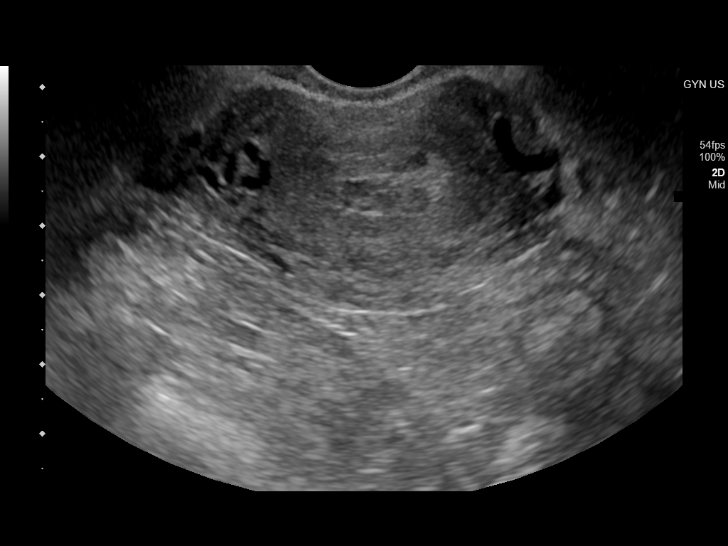
[im 69/109]
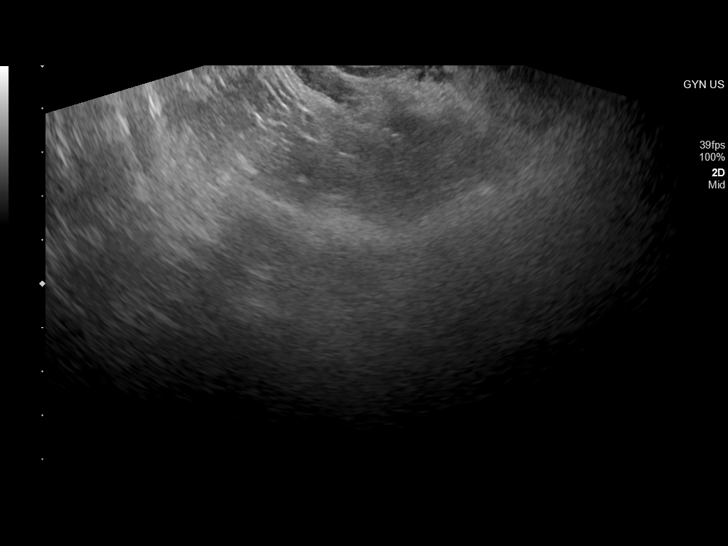
[im 77/109]
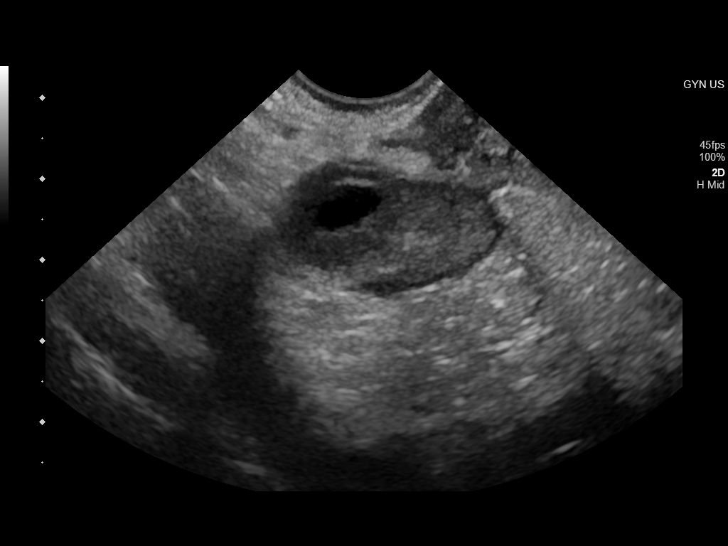
[im 85/109]
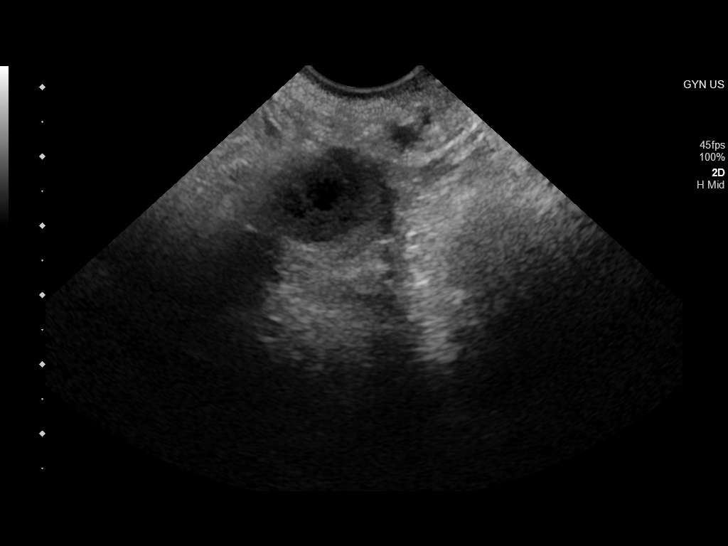
[im 93/109]
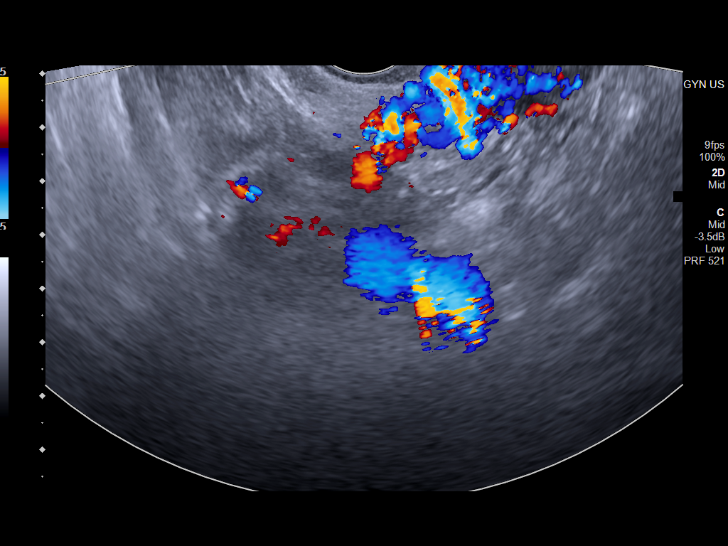
[im 101/109]
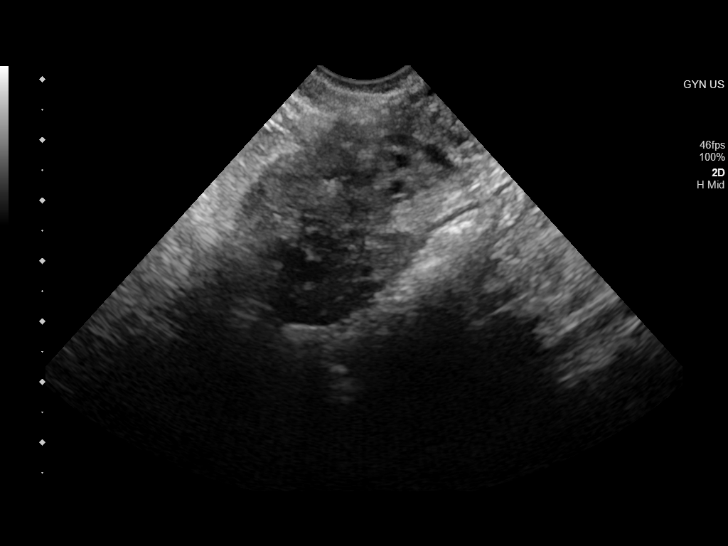
[im 109/109]
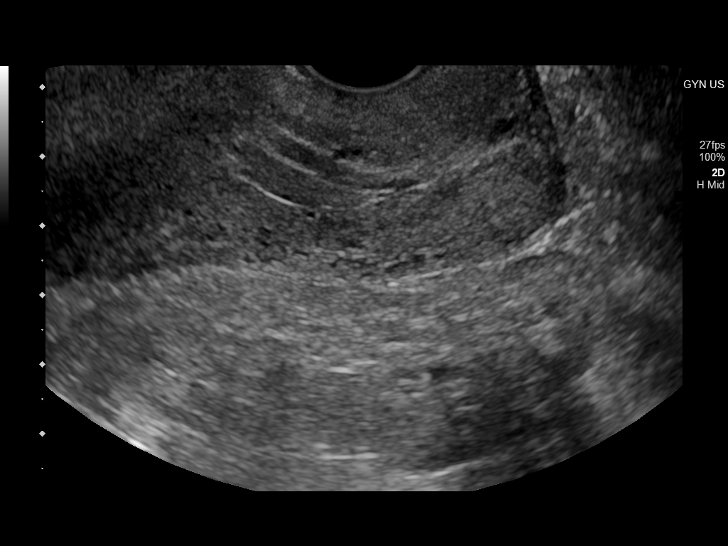

[14 of 28 positions shown; findings below may reference images not displayed]

FINDINGS: Intrauterine gestational sac: None

Yolk sac:  Not Visualized.

Embryo:  Not Visualized.

Maternal uterus/adnexae: Endometrial thickness of 14.4 mm. Increased
vascularity within the uterus and endometrium. Ovaries are within
normal limits. The right ovary measures 2.8 x 1.5 by 1.8 cm. The
left ovary measures 3 x 1.7 by 1.6 cm. No significant free fluid.
IMPRESSION: 1. The previously noted intrauterine pregnancy is no longer
visualized.
2. Endometrial thickness of 14.4 mm with increased vascularity,
findings are suspicious for retained products of conception in the
appropriate clinical setting.

## 2023-12-21 ENCOUNTER — Ambulatory Visit (LOCAL_COMMUNITY_HEALTH_CENTER): Payer: Self-pay | Admitting: Family Medicine

## 2023-12-21 VITALS — BP 118/85 | HR 82 | Wt 213.6 lb

## 2023-12-21 DIAGNOSIS — Z3009 Encounter for other general counseling and advice on contraception: Secondary | ICD-10-CM

## 2023-12-21 DIAGNOSIS — Z3042 Encounter for surveillance of injectable contraceptive: Secondary | ICD-10-CM

## 2023-12-21 DIAGNOSIS — Z124 Encounter for screening for malignant neoplasm of cervix: Secondary | ICD-10-CM

## 2023-12-21 NOTE — Assessment & Plan Note (Signed)
 Patient considering OCPs in future in order to have a period. We discussed today how long to continue using birth control id she does not desire any more children. She is amenable to depo provera  today but at her next appointment in 3 months, may want pills. Discussed that she would need to see a provider at that time in order to switch to an appropriate OCP.

## 2023-12-21 NOTE — Progress Notes (Signed)
 [facility] HEALTH DEPARTMENT [facility] [address], [address] [place] [address] Main phone: [phone]  Family Planning Visit - Repeat Yearly Visit  Due to language barrier, a Spanish interpreter Eudelia Hero, [phone] ) was present by phone during the history-taking, subsequent discussion, and physical exam with this patient.   Subjective:  Shirley Park is a 38 y.o. [phone]  being seen today for an annual wellness visit and to discuss contraception options. The patient is currently using hormonal injection for pregnancy prevention. Patient does not want a pregnancy in the next year.   Patient reports they are looking for a method with the following characteristics:  Does not involve insertion  Discrete method Long term method  Patient has the following medical problems:  Patient Active Problem List   Diagnosis Date Noted   Encounter for surveillance of injectable contraceptive 12/21/2023   History of gestational diabetes mellitus 08/07/2022   Depression 05/02/2022   Illiterate: can't read or write or sign her name 07/16/2021   Obesity (BMI 30-39.9) 03/28/2019   Cervical dysplasia 12/10/2016   Chief Complaint  Patient presents with   Acute Visit    Pt is here for Pap test and Depo   HPI Patient reports she would like her PAP smear and depo provera .   Patient denies need for STI testing.    Review of Systems  Constitutional:  Negative for fever, malaise/fatigue and weight loss.  Respiratory:  Negative for shortness of breath.   Cardiovascular:  Negative for chest pain and palpitations.   See flowsheet for other program required questions.   Diabetes screening This patient is 38 y.o. with a BMI of Body mass index is 37.84 kg/m.Shirley Park  Is patient eligible for diabetes screening (age >35 and BMI >25)?  yes  Was Hgb A1c ordered? Patient politely declined  STI screening Patient reports 1 of partners in last year.  Does this  patient desire STI screening?  No - declines  Hepatitis C screening Has patient been screened once for HCV in the past?  No  No results found for: "HCVAB"  Does the patient meet criteria for HCV testing? No   Hepatitis B screening Does the patient meet criteria for HBV testing? No  Cervical Cancer Screening  Result Date Procedure Results Follow-ups  05/11/2020 IGP, Aptima HPV DIAGNOSIS:: Comment Specimen adequacy:: Comment Clinician Provided ICD10: Comment Performed by:: Comment PAP Smear Comment: . Note:: Comment Test Methodology: Comment HPV Aptima: Negative    Health Maintenance Due  Topic Date Due   COVID-19 Vaccine (3 - 2024-25 season) 05/03/2023   The following portions of the patient's history were reviewed and updated as appropriate: allergies, current medications, past family history, past medical history, past social history, past surgical history and problem list. Problem list updated.  Objective:   Vitals:   12/21/23 0933  BP: 118/85  Pulse: 82  Weight: 213 lb 9.6 oz (96.9 kg)   Physical Exam Vitals and nursing note reviewed. Exam conducted with a chaperone present Lawrence Pretty, RN).  Constitutional:      Appearance: Normal appearance.  HENT:     Head: Normocephalic and atraumatic.     Mouth/Throat:     Mouth: Mucous membranes are moist.     Pharynx: Oropharynx is clear. No oropharyngeal exudate or posterior oropharyngeal erythema.  Eyes:     General: No scleral icterus.       Right eye: No discharge.        Left eye: No discharge.  Conjunctiva/sclera: Conjunctivae normal.  Pulmonary:     Effort: Pulmonary effort is normal.  Genitourinary:    General: Normal vulva.     Exam position: Lithotomy position.     Pubic Area: No rash or pubic lice.      Tanner stage (genital): 5.     Labia:        Right: No rash or lesion.        Left: No rash or lesion.      Vagina: Normal. No vaginal discharge, erythema, bleeding or lesions.     Cervix: Normal. No  cervical motion tenderness, discharge, friability, lesion or erythema.     Uterus: Normal.      Adnexa: Right adnexa normal and left adnexa normal.       Right: No mass, tenderness or fullness.         Left: No mass, tenderness or fullness.       Rectum: Normal.     Comments: pH = not performed, no vaginal complaints Musculoskeletal:        General: Normal range of motion.     Cervical back: Neck supple. No rigidity or tenderness.  Lymphadenopathy:     Head:     Right side of head: No preauricular or posterior auricular adenopathy.     Left side of head: No preauricular or posterior auricular adenopathy.     Cervical: No cervical adenopathy.     Right cervical: No superficial or posterior cervical adenopathy.    Left cervical: No superficial or posterior cervical adenopathy.     Upper Body:     Right upper body: No supraclavicular adenopathy.     Left upper body: No supraclavicular adenopathy.  Skin:    General: Skin is warm and dry.     Capillary Refill: Capillary refill takes less than 2 seconds.     Findings: No rash.  Neurological:     General: No focal deficit present.     Mental Status: She is alert and oriented to person, place, and time.  Psychiatric:        Mood and Affect: Mood normal.        Behavior: Behavior normal.    Assessment and Plan:  Shirley Park is a 38 y.o. female [phone] presenting to the [facility] Department for an yearly wellness and contraception visit  Contraception counseling: Reviewed options based on patient desire and reproductive life plan. Patient is interested in Hormonal Injection. This was provided to the patient today.   Risks, benefits, and typical effectiveness rates were reviewed.  Questions were answered.  Written information was also given to the patient to review.    The patient will follow up in  3 months for surveillance.  The patient was told to call with any further questions, or with any concerns about this  method of contraception.  Emphasized use of condoms 100% of the time for STI prevention.  Educated on ECP and assessed need for ECP. Patient does not meet criteria, as she is within dates for depo.   Cervical cancer screening -     IGP, Aptima HPV  Encounter for surveillance of injectable contraceptive Assessment & Plan: Patient considering OCPs in future in order to have a period. We discussed today how long to continue using birth control id she does not desire any more children. She is amenable to depo provera  today but at her next appointment in 3 months, may want pills. Discussed that she would need to see a  provider at that time in order to switch to an appropriate OCP.     No follow-ups on file.  No future appointments.   Jack Marts, MD

## 2023-12-21 NOTE — Progress Notes (Addendum)
 Patient is here for an acute visit and depo injection. Depo Injection given at Rt deltoid and Pt tolerated well to injection. Patient given the opportunity to ask questions for any clarification. Condoms declined and reminder card given. Austine Lefort, RN.

## 2023-12-21 NOTE — Patient Instructions (Addendum)
 I translated the following text using Google translate.  Please excuse any errors.  He traducido el siguiente texto con el traductor de Microbiologist. Disculpe cualquier error.   PAP Smear Today we performed a PAP smear to screen for cervical cancer. The results should be back in 1-2 weeks.  Once we have the results we can determine when your next screening should be.   Prueba de Papanicolaou [place] le realizamos una prueba de Papanicolaou para detectar cncer de cuello uterino. Los resultados deberan estar disponibles en una o [employer]. Una vez que los tengamos, podremos determinar cundo debe realizarse su prxima prueba.  Diabetes screen Today we collected your blood to test for diabetes.  We will call you if the test is positive.  Prueba de diabetes [place] le extrajimos sangre para detectar diabetes. Le llamaremos si la prueba es positiva.

## 2023-12-31 LAB — IGP, APTIMA HPV
HPV Aptima: NEGATIVE
PAP Smear Comment: 0

## 2024-01-12 ENCOUNTER — Ambulatory Visit: Payer: Self-pay
